# Patient Record
Sex: Female | Born: 1945 | Race: White | Hispanic: No | Marital: Married | Smoking: Former smoker
Health system: Southern US, Community
[De-identification: ages and names within clinical notes are randomized; demographics above are authoritative.]

## PROBLEM LIST (undated history)

## (undated) DIAGNOSIS — M8580 Other specified disorders of bone density and structure, unspecified site: Secondary | ICD-10-CM

## (undated) DIAGNOSIS — E538 Deficiency of other specified B group vitamins: Secondary | ICD-10-CM

## (undated) DIAGNOSIS — E7800 Pure hypercholesterolemia, unspecified: Secondary | ICD-10-CM

## (undated) DIAGNOSIS — F419 Anxiety disorder, unspecified: Secondary | ICD-10-CM

## (undated) DIAGNOSIS — M1990 Unspecified osteoarthritis, unspecified site: Secondary | ICD-10-CM

## (undated) DIAGNOSIS — E559 Vitamin D deficiency, unspecified: Secondary | ICD-10-CM

## (undated) HISTORY — DX: Vitamin D deficiency, unspecified: E55.9

## (undated) HISTORY — DX: Unspecified osteoarthritis, unspecified site: M19.90

## (undated) HISTORY — PX: TONSILLECTOMY AND ADENOIDECTOMY: SHX28

## (undated) HISTORY — DX: Other specified disorders of bone density and structure, unspecified site: M85.80

## (undated) HISTORY — PX: BREAST EXCISIONAL BIOPSY: SUR124

## (undated) HISTORY — DX: Anxiety disorder, unspecified: F41.9

## (undated) HISTORY — PX: TUBAL LIGATION: SHX77

## (undated) HISTORY — DX: Deficiency of other specified B group vitamins: E53.8

## (undated) HISTORY — DX: Pure hypercholesterolemia, unspecified: E78.00

---

## 1997-12-07 ENCOUNTER — Other Ambulatory Visit: Admission: RE | Admit: 1997-12-07 | Discharge: 1997-12-07 | Payer: Self-pay | Admitting: Obstetrics and Gynecology

## 1998-07-03 ENCOUNTER — Other Ambulatory Visit: Admission: RE | Admit: 1998-07-03 | Discharge: 1998-07-03 | Payer: Self-pay | Admitting: Obstetrics and Gynecology

## 1999-08-03 ENCOUNTER — Other Ambulatory Visit: Admission: RE | Admit: 1999-08-03 | Discharge: 1999-08-03 | Payer: Self-pay | Admitting: *Deleted

## 2000-09-11 ENCOUNTER — Other Ambulatory Visit: Admission: RE | Admit: 2000-09-11 | Discharge: 2000-09-11 | Payer: Self-pay | Admitting: *Deleted

## 2000-09-12 ENCOUNTER — Encounter: Payer: Self-pay | Admitting: *Deleted

## 2000-09-12 ENCOUNTER — Encounter: Admission: RE | Admit: 2000-09-12 | Discharge: 2000-09-12 | Payer: Self-pay | Admitting: *Deleted

## 2001-10-14 ENCOUNTER — Other Ambulatory Visit: Admission: RE | Admit: 2001-10-14 | Discharge: 2001-10-14 | Payer: Self-pay | Admitting: Obstetrics and Gynecology

## 2003-02-02 ENCOUNTER — Other Ambulatory Visit: Admission: RE | Admit: 2003-02-02 | Discharge: 2003-02-02 | Payer: Self-pay | Admitting: Obstetrics and Gynecology

## 2003-03-09 ENCOUNTER — Encounter: Payer: Self-pay | Admitting: Obstetrics and Gynecology

## 2003-03-09 ENCOUNTER — Encounter: Admission: RE | Admit: 2003-03-09 | Discharge: 2003-03-09 | Payer: Self-pay | Admitting: Obstetrics and Gynecology

## 2003-04-28 ENCOUNTER — Encounter: Admission: RE | Admit: 2003-04-28 | Discharge: 2003-04-28 | Payer: Self-pay | Admitting: Obstetrics and Gynecology

## 2003-04-28 ENCOUNTER — Encounter (INDEPENDENT_AMBULATORY_CARE_PROVIDER_SITE_OTHER): Payer: Self-pay | Admitting: Specialist

## 2003-04-28 ENCOUNTER — Encounter: Payer: Self-pay | Admitting: Obstetrics and Gynecology

## 2004-05-01 ENCOUNTER — Encounter: Admission: RE | Admit: 2004-05-01 | Discharge: 2004-05-01 | Payer: Self-pay | Admitting: Obstetrics and Gynecology

## 2004-08-13 ENCOUNTER — Encounter (INDEPENDENT_AMBULATORY_CARE_PROVIDER_SITE_OTHER): Payer: Self-pay | Admitting: *Deleted

## 2004-08-13 ENCOUNTER — Ambulatory Visit (HOSPITAL_COMMUNITY): Admission: RE | Admit: 2004-08-13 | Discharge: 2004-08-13 | Payer: Self-pay | Admitting: Gastroenterology

## 2005-04-11 ENCOUNTER — Encounter: Admission: RE | Admit: 2005-04-11 | Discharge: 2005-04-11 | Payer: Self-pay | Admitting: Obstetrics and Gynecology

## 2006-05-01 ENCOUNTER — Encounter: Admission: RE | Admit: 2006-05-01 | Discharge: 2006-05-01 | Payer: Self-pay | Admitting: Obstetrics and Gynecology

## 2007-05-28 ENCOUNTER — Encounter: Admission: RE | Admit: 2007-05-28 | Discharge: 2007-05-28 | Payer: Self-pay | Admitting: Obstetrics & Gynecology

## 2007-06-05 ENCOUNTER — Encounter: Admission: RE | Admit: 2007-06-05 | Discharge: 2007-06-05 | Payer: Self-pay | Admitting: Obstetrics & Gynecology

## 2007-12-03 ENCOUNTER — Encounter: Admission: RE | Admit: 2007-12-03 | Discharge: 2007-12-03 | Payer: Self-pay | Admitting: Obstetrics and Gynecology

## 2008-07-11 ENCOUNTER — Encounter: Admission: RE | Admit: 2008-07-11 | Discharge: 2008-07-11 | Payer: Self-pay | Admitting: Family Medicine

## 2009-09-29 ENCOUNTER — Encounter: Admission: RE | Admit: 2009-09-29 | Discharge: 2009-09-29 | Payer: Self-pay | Admitting: Obstetrics

## 2010-07-29 ENCOUNTER — Encounter: Payer: Self-pay | Admitting: Obstetrics & Gynecology

## 2010-09-04 ENCOUNTER — Other Ambulatory Visit: Payer: Self-pay | Admitting: Obstetrics

## 2010-09-04 DIAGNOSIS — Z1231 Encounter for screening mammogram for malignant neoplasm of breast: Secondary | ICD-10-CM

## 2010-09-12 ENCOUNTER — Other Ambulatory Visit: Payer: Self-pay | Admitting: Obstetrics

## 2010-09-12 DIAGNOSIS — M8580 Other specified disorders of bone density and structure, unspecified site: Secondary | ICD-10-CM

## 2010-11-02 ENCOUNTER — Ambulatory Visit
Admission: RE | Admit: 2010-11-02 | Discharge: 2010-11-02 | Disposition: A | Discharge status: Home / Self Care | Payer: Medicare Other | Source: Ambulatory Visit | Attending: Obstetrics | Admitting: Obstetrics

## 2010-11-02 ENCOUNTER — Ambulatory Visit: Payer: Self-pay

## 2010-11-02 DIAGNOSIS — Z1231 Encounter for screening mammogram for malignant neoplasm of breast: Secondary | ICD-10-CM

## 2010-11-02 DIAGNOSIS — M8580 Other specified disorders of bone density and structure, unspecified site: Secondary | ICD-10-CM

## 2010-11-05 ENCOUNTER — Other Ambulatory Visit: Payer: Self-pay | Admitting: Obstetrics

## 2010-11-05 DIAGNOSIS — R928 Other abnormal and inconclusive findings on diagnostic imaging of breast: Secondary | ICD-10-CM

## 2010-11-12 ENCOUNTER — Other Ambulatory Visit: Payer: Medicare Other

## 2010-11-13 ENCOUNTER — Ambulatory Visit
Admission: RE | Admit: 2010-11-13 | Discharge: 2010-11-13 | Disposition: A | Discharge status: Home / Self Care | Payer: Medicare Other | Source: Ambulatory Visit | Attending: Obstetrics | Admitting: Obstetrics

## 2010-11-13 DIAGNOSIS — R928 Other abnormal and inconclusive findings on diagnostic imaging of breast: Secondary | ICD-10-CM

## 2010-11-23 NOTE — Op Note (Signed)
Nicole Horne, HAGWOOD                ACCOUNT NO.:  [id number]   MEDICAL RECORD NO.:  [id number]          PATIENT TYPE:  AMB   LOCATION:  ENDO                         FACILITY:  [facility]   PHYSICIAN:  Anselmo Rod, M.D.  DATE OF BIRTH:  1946/06/08   DATE OF PROCEDURE:  08/13/2004  DATE OF DISCHARGE:                                 OPERATIVE REPORT   PROCEDURE PERFORMED:  Colonoscopy with snare polypectomy times one (cold  snare).   ENDOSCOPIST:  Charna Elizabeth, M.D.   INSTRUMENT USED:  Olympus video colonoscope.   INDICATIONS FOR PROCEDURE:  The patient is a 65 year old white female  undergoing screening colonoscopy to rule out colonic polyps, masses, etc.   PREPROCEDURE PREPARATION:  Informed consent was procured from the patient.  The patient was fasted for eight hours prior to the procedure and prepped  with a bottle of magnesium citrate and a gallon of GoLYTELY the night prior  to the procedure.  The risks and benefits of the procedure including a 10%  miss rate for colon polyps or cancers was discussed with the patient as  well.   PREPROCEDURE PHYSICAL:  The patient had stable vital signs.  Neck supple.  Chest clear to auscultation.  S1 and S2 regular.  Abdomen soft with normal  bowel sounds.   DESCRIPTION OF PROCEDURE:  The patient was placed in left lateral decubitus  position and sedated with 70 mg of Demerol and 7 mg of Versed in slow  incremental doses.  Once the patient was adequately sedated and maintained  on low flow oxygen and continuous cardiac monitoring, the Olympus video  colonoscope was advanced from the rectum to the cecum with great difficulty.  The patient had a very tortuous colon.  The patient's position was changed  from the left lateral to the supine position with gentle application of  abdominal pressure to reach the cecal base.  The appendicular orifice and  ileocecal valve were clearly visualized and photographed.  The terminal  ileum appeared healthy  and without lesions.  A small sessile polyp was  snared (cold snare) from the rectum.  No other masses, polyps, erosions,  ulcerations or diverticula were seen, retroflexion revealed no  abnormalities.  The patient tolerated the procedure well without immediate  complication.   IMPRESSION:  Normal colonoscopy up to the terminal ileum except for a small  sessile polyp removed by cold snare from the rectum.   RECOMMENDATIONS:  1.  Await pathology results.  2.  Avoid all nonsteroidals including aspirin for the next two weeks.  3.  Repeat colonoscopy depending on pathology results.  4.  Outpatient follow-up as need arises in the future.      JNM/MEDQ  D:  08/13/2004  T:  08/13/2004  Job:  [id number]   cc:   Sherry A. Rosalio Macadamia, M.D.  [address]  [place]  [place] [address]  Fax: [phone]

## 2011-09-07 DIAGNOSIS — H26499 Other secondary cataract, unspecified eye: Secondary | ICD-10-CM | POA: Diagnosis not present

## 2011-09-18 DIAGNOSIS — H11159 Pinguecula, unspecified eye: Secondary | ICD-10-CM | POA: Diagnosis not present

## 2011-09-18 DIAGNOSIS — H26499 Other secondary cataract, unspecified eye: Secondary | ICD-10-CM | POA: Diagnosis not present

## 2011-10-10 DIAGNOSIS — Z124 Encounter for screening for malignant neoplasm of cervix: Secondary | ICD-10-CM | POA: Diagnosis not present

## 2011-10-10 DIAGNOSIS — M899 Disorder of bone, unspecified: Secondary | ICD-10-CM | POA: Diagnosis not present

## 2011-10-10 DIAGNOSIS — M949 Disorder of cartilage, unspecified: Secondary | ICD-10-CM | POA: Diagnosis not present

## 2011-10-19 DIAGNOSIS — H26499 Other secondary cataract, unspecified eye: Secondary | ICD-10-CM | POA: Diagnosis not present

## 2012-01-30 ENCOUNTER — Other Ambulatory Visit: Payer: Self-pay | Admitting: Obstetrics

## 2012-01-30 DIAGNOSIS — Z1231 Encounter for screening mammogram for malignant neoplasm of breast: Secondary | ICD-10-CM

## 2012-02-27 ENCOUNTER — Ambulatory Visit
Admission: RE | Admit: 2012-02-27 | Discharge: 2012-02-27 | Disposition: A | Discharge status: Home / Self Care | Payer: Medicare Other | Source: Ambulatory Visit | Attending: Obstetrics | Admitting: Obstetrics

## 2012-02-27 DIAGNOSIS — Z1231 Encounter for screening mammogram for malignant neoplasm of breast: Secondary | ICD-10-CM

## 2012-04-07 DIAGNOSIS — Z23 Encounter for immunization: Secondary | ICD-10-CM | POA: Diagnosis not present

## 2012-07-31 DIAGNOSIS — L570 Actinic keratosis: Secondary | ICD-10-CM | POA: Diagnosis not present

## 2012-07-31 DIAGNOSIS — L820 Inflamed seborrheic keratosis: Secondary | ICD-10-CM | POA: Diagnosis not present

## 2012-08-06 DIAGNOSIS — C44721 Squamous cell carcinoma of skin of unspecified lower limb, including hip: Secondary | ICD-10-CM | POA: Diagnosis not present

## 2012-12-02 DIAGNOSIS — Z01419 Encounter for gynecological examination (general) (routine) without abnormal findings: Secondary | ICD-10-CM | POA: Diagnosis not present

## 2012-12-02 DIAGNOSIS — Z124 Encounter for screening for malignant neoplasm of cervix: Secondary | ICD-10-CM | POA: Diagnosis not present

## 2012-12-04 ENCOUNTER — Other Ambulatory Visit: Payer: Self-pay | Admitting: Obstetrics

## 2012-12-04 DIAGNOSIS — Z1231 Encounter for screening mammogram for malignant neoplasm of breast: Secondary | ICD-10-CM

## 2012-12-04 DIAGNOSIS — M8580 Other specified disorders of bone density and structure, unspecified site: Secondary | ICD-10-CM

## 2013-01-29 DIAGNOSIS — M1990 Unspecified osteoarthritis, unspecified site: Secondary | ICD-10-CM | POA: Diagnosis not present

## 2013-01-29 DIAGNOSIS — Z0000 Encounter for general adult medical examination without abnormal findings: Secondary | ICD-10-CM | POA: Diagnosis not present

## 2013-01-29 DIAGNOSIS — R5383 Other fatigue: Secondary | ICD-10-CM | POA: Diagnosis not present

## 2013-01-29 DIAGNOSIS — R5381 Other malaise: Secondary | ICD-10-CM | POA: Diagnosis not present

## 2013-02-16 DIAGNOSIS — L821 Other seborrheic keratosis: Secondary | ICD-10-CM | POA: Diagnosis not present

## 2013-03-05 DIAGNOSIS — E782 Mixed hyperlipidemia: Secondary | ICD-10-CM | POA: Diagnosis not present

## 2013-03-05 DIAGNOSIS — Z79899 Other long term (current) drug therapy: Secondary | ICD-10-CM | POA: Diagnosis not present

## 2013-03-05 DIAGNOSIS — I10 Essential (primary) hypertension: Secondary | ICD-10-CM | POA: Diagnosis not present

## 2013-04-02 ENCOUNTER — Ambulatory Visit
Admission: RE | Admit: 2013-04-02 | Discharge: 2013-04-02 | Disposition: A | Discharge status: Home / Self Care | Payer: Medicare Other | Source: Ambulatory Visit | Attending: Obstetrics | Admitting: Obstetrics

## 2013-04-02 ENCOUNTER — Other Ambulatory Visit: Payer: Medicare Other

## 2013-04-02 DIAGNOSIS — Z1231 Encounter for screening mammogram for malignant neoplasm of breast: Secondary | ICD-10-CM | POA: Diagnosis not present

## 2013-04-26 DIAGNOSIS — H26499 Other secondary cataract, unspecified eye: Secondary | ICD-10-CM | POA: Diagnosis not present

## 2013-04-27 DIAGNOSIS — Z23 Encounter for immunization: Secondary | ICD-10-CM | POA: Diagnosis not present

## 2013-05-27 ENCOUNTER — Ambulatory Visit
Admission: RE | Admit: 2013-05-27 | Discharge: 2013-05-27 | Disposition: A | Discharge status: Home / Self Care | Payer: Medicare Other | Source: Ambulatory Visit | Attending: Obstetrics | Admitting: Obstetrics

## 2013-05-27 DIAGNOSIS — M8580 Other specified disorders of bone density and structure, unspecified site: Secondary | ICD-10-CM

## 2013-05-27 DIAGNOSIS — M899 Disorder of bone, unspecified: Secondary | ICD-10-CM | POA: Diagnosis not present

## 2013-09-10 DIAGNOSIS — E7800 Pure hypercholesterolemia, unspecified: Secondary | ICD-10-CM | POA: Diagnosis not present

## 2013-09-10 DIAGNOSIS — Z0000 Encounter for general adult medical examination without abnormal findings: Secondary | ICD-10-CM | POA: Diagnosis not present

## 2013-09-10 DIAGNOSIS — E559 Vitamin D deficiency, unspecified: Secondary | ICD-10-CM | POA: Diagnosis not present

## 2013-09-10 DIAGNOSIS — Z23 Encounter for immunization: Secondary | ICD-10-CM | POA: Diagnosis not present

## 2013-09-10 DIAGNOSIS — R6889 Other general symptoms and signs: Secondary | ICD-10-CM | POA: Diagnosis not present

## 2014-04-07 DIAGNOSIS — Z23 Encounter for immunization: Secondary | ICD-10-CM | POA: Diagnosis not present

## 2014-05-18 ENCOUNTER — Other Ambulatory Visit: Payer: Self-pay

## 2014-05-18 DIAGNOSIS — Z1231 Encounter for screening mammogram for malignant neoplasm of breast: Secondary | ICD-10-CM

## 2014-06-16 ENCOUNTER — Ambulatory Visit
Admission: RE | Admit: 2014-06-16 | Discharge: 2014-06-16 | Disposition: A | Discharge status: Home / Self Care | Payer: Medicare Other | Source: Ambulatory Visit

## 2014-06-16 DIAGNOSIS — Z1231 Encounter for screening mammogram for malignant neoplasm of breast: Secondary | ICD-10-CM | POA: Diagnosis not present

## 2014-09-05 DIAGNOSIS — Z1389 Encounter for screening for other disorder: Secondary | ICD-10-CM | POA: Diagnosis not present

## 2014-09-05 DIAGNOSIS — F341 Dysthymic disorder: Secondary | ICD-10-CM | POA: Diagnosis not present

## 2014-09-05 DIAGNOSIS — Z23 Encounter for immunization: Secondary | ICD-10-CM | POA: Diagnosis not present

## 2014-09-05 DIAGNOSIS — Z79899 Other long term (current) drug therapy: Secondary | ICD-10-CM | POA: Diagnosis not present

## 2014-09-05 DIAGNOSIS — R5383 Other fatigue: Secondary | ICD-10-CM | POA: Diagnosis not present

## 2014-09-05 DIAGNOSIS — E559 Vitamin D deficiency, unspecified: Secondary | ICD-10-CM | POA: Diagnosis not present

## 2014-09-05 DIAGNOSIS — M1990 Unspecified osteoarthritis, unspecified site: Secondary | ICD-10-CM | POA: Diagnosis not present

## 2014-09-05 DIAGNOSIS — D509 Iron deficiency anemia, unspecified: Secondary | ICD-10-CM | POA: Diagnosis not present

## 2015-05-15 DIAGNOSIS — Z23 Encounter for immunization: Secondary | ICD-10-CM | POA: Diagnosis not present

## 2015-06-15 DIAGNOSIS — E7800 Pure hypercholesterolemia, unspecified: Secondary | ICD-10-CM | POA: Diagnosis not present

## 2015-06-15 DIAGNOSIS — F419 Anxiety disorder, unspecified: Secondary | ICD-10-CM | POA: Diagnosis not present

## 2015-06-15 DIAGNOSIS — M8580 Other specified disorders of bone density and structure, unspecified site: Secondary | ICD-10-CM | POA: Diagnosis not present

## 2015-06-21 DIAGNOSIS — M859 Disorder of bone density and structure, unspecified: Secondary | ICD-10-CM | POA: Diagnosis not present

## 2015-06-21 DIAGNOSIS — Z13820 Encounter for screening for osteoporosis: Secondary | ICD-10-CM | POA: Diagnosis not present

## 2015-08-17 DIAGNOSIS — L821 Other seborrheic keratosis: Secondary | ICD-10-CM | POA: Diagnosis not present

## 2015-08-17 DIAGNOSIS — D225 Melanocytic nevi of trunk: Secondary | ICD-10-CM | POA: Diagnosis not present

## 2015-11-10 ENCOUNTER — Other Ambulatory Visit: Payer: Self-pay

## 2015-11-10 DIAGNOSIS — Z1231 Encounter for screening mammogram for malignant neoplasm of breast: Secondary | ICD-10-CM

## 2015-11-23 ENCOUNTER — Ambulatory Visit
Admission: RE | Admit: 2015-11-23 | Discharge: 2015-11-23 | Disposition: A | Discharge status: Home / Self Care | Payer: Medicare Other | Source: Ambulatory Visit

## 2015-11-23 DIAGNOSIS — Z1231 Encounter for screening mammogram for malignant neoplasm of breast: Secondary | ICD-10-CM | POA: Diagnosis not present

## 2016-03-05 ENCOUNTER — Other Ambulatory Visit: Payer: Self-pay

## 2016-04-30 DIAGNOSIS — Z23 Encounter for immunization: Secondary | ICD-10-CM | POA: Diagnosis not present

## 2016-04-30 DIAGNOSIS — Z79899 Other long term (current) drug therapy: Secondary | ICD-10-CM | POA: Diagnosis not present

## 2016-04-30 DIAGNOSIS — Z0000 Encounter for general adult medical examination without abnormal findings: Secondary | ICD-10-CM | POA: Diagnosis not present

## 2016-05-07 DIAGNOSIS — M1990 Unspecified osteoarthritis, unspecified site: Secondary | ICD-10-CM | POA: Diagnosis not present

## 2016-05-07 DIAGNOSIS — Z1389 Encounter for screening for other disorder: Secondary | ICD-10-CM | POA: Diagnosis not present

## 2016-05-07 DIAGNOSIS — Z9181 History of falling: Secondary | ICD-10-CM | POA: Diagnosis not present

## 2016-05-07 DIAGNOSIS — Z0000 Encounter for general adult medical examination without abnormal findings: Secondary | ICD-10-CM | POA: Diagnosis not present

## 2016-05-07 DIAGNOSIS — E785 Hyperlipidemia, unspecified: Secondary | ICD-10-CM | POA: Diagnosis not present

## 2016-08-27 DIAGNOSIS — J40 Bronchitis, not specified as acute or chronic: Secondary | ICD-10-CM | POA: Diagnosis not present

## 2016-12-10 DIAGNOSIS — H524 Presbyopia: Secondary | ICD-10-CM | POA: Diagnosis not present

## 2016-12-10 DIAGNOSIS — H52223 Regular astigmatism, bilateral: Secondary | ICD-10-CM | POA: Diagnosis not present

## 2016-12-10 DIAGNOSIS — Z9842 Cataract extraction status, left eye: Secondary | ICD-10-CM | POA: Diagnosis not present

## 2016-12-10 DIAGNOSIS — Z9841 Cataract extraction status, right eye: Secondary | ICD-10-CM | POA: Diagnosis not present

## 2016-12-10 DIAGNOSIS — H43393 Other vitreous opacities, bilateral: Secondary | ICD-10-CM | POA: Diagnosis not present

## 2016-12-10 DIAGNOSIS — H43813 Vitreous degeneration, bilateral: Secondary | ICD-10-CM | POA: Diagnosis not present

## 2016-12-10 DIAGNOSIS — H5213 Myopia, bilateral: Secondary | ICD-10-CM | POA: Diagnosis not present

## 2016-12-10 DIAGNOSIS — Z961 Presence of intraocular lens: Secondary | ICD-10-CM | POA: Diagnosis not present

## 2017-01-03 ENCOUNTER — Other Ambulatory Visit: Payer: Self-pay | Admitting: Family Medicine

## 2017-01-03 DIAGNOSIS — Z1231 Encounter for screening mammogram for malignant neoplasm of breast: Secondary | ICD-10-CM

## 2017-01-21 ENCOUNTER — Ambulatory Visit
Admission: RE | Admit: 2017-01-21 | Discharge: 2017-01-21 | Disposition: A | Discharge status: Home / Self Care | Payer: Medicare Other | Source: Ambulatory Visit | Attending: Family Medicine | Admitting: Family Medicine

## 2017-01-21 DIAGNOSIS — Z1231 Encounter for screening mammogram for malignant neoplasm of breast: Secondary | ICD-10-CM | POA: Diagnosis not present

## 2017-02-14 ENCOUNTER — Ambulatory Visit (INDEPENDENT_AMBULATORY_CARE_PROVIDER_SITE_OTHER): Payer: Medicare Other | Admitting: Podiatry

## 2017-02-14 ENCOUNTER — Encounter: Payer: Self-pay | Admitting: Podiatry

## 2017-02-14 ENCOUNTER — Ambulatory Visit (INDEPENDENT_AMBULATORY_CARE_PROVIDER_SITE_OTHER): Payer: Medicare Other

## 2017-02-14 DIAGNOSIS — M21619 Bunion of unspecified foot: Secondary | ICD-10-CM | POA: Diagnosis not present

## 2017-02-14 DIAGNOSIS — M2040 Other hammer toe(s) (acquired), unspecified foot: Secondary | ICD-10-CM | POA: Diagnosis not present

## 2017-02-14 NOTE — Progress Notes (Signed)
Subjective:    Patient ID: Nicole Horne, female   DOB: 71 y.o.   MRN: [id number]   HPI patient presents with significant structural deformities right over left foot with history of bunion and hallux right overlapping the second toe    Review of Systems  All other systems reviewed and are negative.       Objective:  Physical Exam  Constitutional: She appears well-developed and well-nourished.  Cardiovascular: Intact distal pulses.   Musculoskeletal: Normal range of motion.  Neurological: She is alert.  Skin: Skin is warm.  Nursing note and vitals reviewed.  neurovascular status found to be intact muscle strength is adequate range of motion within normal limits with patient found to have significant structural deformity right over left with history of surgery on both feet with Lapidus type fusions. It is not significant with painful but it is cosmetically concerning to the patient and she's concerned about future     Assessment:   Significant structural changes with hallux overlapping the second toe right over left      Plan:     H&P x-ray reviewed and different treatment options discussed. At this point were to avoid surgery and just utilize wider-type shoes and if he gets worse we'll need to consider fusion of the first MPJ which would be necessary for this  X-ray indicates there is significant structural malalignment with screws that are in place with good fusion of the metatarsocuneiform joint

## 2017-02-14 NOTE — Progress Notes (Signed)
   Subjective:    Patient ID: Nicole Horne, female    DOB: 1946-03-16, 71 y.o.   MRN: [id number]  HPI Chief Complaint  Patient presents with  . Hammertoe    Right foot; great toe going over 2nd toe; pt stated, "Had hammertoe surgery approximately 8 to 10 years ago and now toes are going back over each other again"; x5-6 years      Review of Systems  All other systems reviewed and are negative.      Objective:   Physical Exam        Assessment & Plan:

## 2017-05-07 DIAGNOSIS — E785 Hyperlipidemia, unspecified: Secondary | ICD-10-CM | POA: Diagnosis not present

## 2017-05-07 DIAGNOSIS — Z79899 Other long term (current) drug therapy: Secondary | ICD-10-CM | POA: Diagnosis not present

## 2017-05-28 DIAGNOSIS — D72819 Decreased white blood cell count, unspecified: Secondary | ICD-10-CM | POA: Diagnosis not present

## 2017-05-28 DIAGNOSIS — Z6825 Body mass index (BMI) 25.0-25.9, adult: Secondary | ICD-10-CM | POA: Diagnosis not present

## 2017-05-28 DIAGNOSIS — Z9181 History of falling: Secondary | ICD-10-CM | POA: Diagnosis not present

## 2017-05-28 DIAGNOSIS — Z1339 Encounter for screening examination for other mental health and behavioral disorders: Secondary | ICD-10-CM | POA: Diagnosis not present

## 2017-05-28 DIAGNOSIS — M1990 Unspecified osteoarthritis, unspecified site: Secondary | ICD-10-CM | POA: Diagnosis not present

## 2017-05-28 DIAGNOSIS — Z0000 Encounter for general adult medical examination without abnormal findings: Secondary | ICD-10-CM | POA: Diagnosis not present

## 2017-06-06 DIAGNOSIS — D510 Vitamin B12 deficiency anemia due to intrinsic factor deficiency: Secondary | ICD-10-CM | POA: Diagnosis not present

## 2017-06-13 DIAGNOSIS — D510 Vitamin B12 deficiency anemia due to intrinsic factor deficiency: Secondary | ICD-10-CM | POA: Diagnosis not present

## 2017-06-20 DIAGNOSIS — D510 Vitamin B12 deficiency anemia due to intrinsic factor deficiency: Secondary | ICD-10-CM | POA: Diagnosis not present

## 2017-07-03 DIAGNOSIS — D510 Vitamin B12 deficiency anemia due to intrinsic factor deficiency: Secondary | ICD-10-CM | POA: Diagnosis not present

## 2017-08-08 DIAGNOSIS — D510 Vitamin B12 deficiency anemia due to intrinsic factor deficiency: Secondary | ICD-10-CM | POA: Diagnosis not present

## 2017-09-05 DIAGNOSIS — D510 Vitamin B12 deficiency anemia due to intrinsic factor deficiency: Secondary | ICD-10-CM | POA: Diagnosis not present

## 2017-10-06 DIAGNOSIS — D510 Vitamin B12 deficiency anemia due to intrinsic factor deficiency: Secondary | ICD-10-CM | POA: Diagnosis not present

## 2017-11-11 DIAGNOSIS — Z13820 Encounter for screening for osteoporosis: Secondary | ICD-10-CM | POA: Diagnosis not present

## 2017-11-11 DIAGNOSIS — D510 Vitamin B12 deficiency anemia due to intrinsic factor deficiency: Secondary | ICD-10-CM | POA: Diagnosis not present

## 2017-12-09 DIAGNOSIS — D510 Vitamin B12 deficiency anemia due to intrinsic factor deficiency: Secondary | ICD-10-CM | POA: Diagnosis not present

## 2018-01-15 DIAGNOSIS — D510 Vitamin B12 deficiency anemia due to intrinsic factor deficiency: Secondary | ICD-10-CM | POA: Diagnosis not present

## 2018-02-10 DIAGNOSIS — D510 Vitamin B12 deficiency anemia due to intrinsic factor deficiency: Secondary | ICD-10-CM | POA: Diagnosis not present

## 2018-03-17 DIAGNOSIS — Z23 Encounter for immunization: Secondary | ICD-10-CM | POA: Diagnosis not present

## 2018-03-17 DIAGNOSIS — D510 Vitamin B12 deficiency anemia due to intrinsic factor deficiency: Secondary | ICD-10-CM | POA: Diagnosis not present

## 2018-03-24 DIAGNOSIS — D1801 Hemangioma of skin and subcutaneous tissue: Secondary | ICD-10-CM | POA: Diagnosis not present

## 2018-03-24 DIAGNOSIS — L814 Other melanin hyperpigmentation: Secondary | ICD-10-CM | POA: Diagnosis not present

## 2018-03-24 DIAGNOSIS — L820 Inflamed seborrheic keratosis: Secondary | ICD-10-CM | POA: Diagnosis not present

## 2018-04-07 ENCOUNTER — Other Ambulatory Visit: Payer: Self-pay | Admitting: Family Medicine

## 2018-04-07 DIAGNOSIS — Z1239 Encounter for other screening for malignant neoplasm of breast: Secondary | ICD-10-CM

## 2018-04-07 DIAGNOSIS — Z1231 Encounter for screening mammogram for malignant neoplasm of breast: Secondary | ICD-10-CM

## 2018-04-08 DIAGNOSIS — D510 Vitamin B12 deficiency anemia due to intrinsic factor deficiency: Secondary | ICD-10-CM | POA: Diagnosis not present

## 2018-05-11 ENCOUNTER — Ambulatory Visit
Admission: RE | Admit: 2018-05-11 | Discharge: 2018-05-11 | Disposition: A | Discharge status: Home / Self Care | Payer: Medicare Other | Source: Ambulatory Visit | Attending: Family Medicine | Admitting: Family Medicine

## 2018-05-11 DIAGNOSIS — Z1231 Encounter for screening mammogram for malignant neoplasm of breast: Secondary | ICD-10-CM | POA: Diagnosis not present

## 2018-05-15 DIAGNOSIS — D510 Vitamin B12 deficiency anemia due to intrinsic factor deficiency: Secondary | ICD-10-CM | POA: Diagnosis not present

## 2018-06-08 DIAGNOSIS — D510 Vitamin B12 deficiency anemia due to intrinsic factor deficiency: Secondary | ICD-10-CM | POA: Diagnosis not present

## 2018-06-08 DIAGNOSIS — E785 Hyperlipidemia, unspecified: Secondary | ICD-10-CM | POA: Diagnosis not present

## 2018-06-08 DIAGNOSIS — E559 Vitamin D deficiency, unspecified: Secondary | ICD-10-CM | POA: Diagnosis not present

## 2018-06-08 DIAGNOSIS — Z9181 History of falling: Secondary | ICD-10-CM | POA: Diagnosis not present

## 2018-06-08 DIAGNOSIS — Z6824 Body mass index (BMI) 24.0-24.9, adult: Secondary | ICD-10-CM | POA: Diagnosis not present

## 2018-06-08 DIAGNOSIS — S41119A Laceration without foreign body of unspecified upper arm, initial encounter: Secondary | ICD-10-CM | POA: Diagnosis not present

## 2018-06-08 DIAGNOSIS — Z79899 Other long term (current) drug therapy: Secondary | ICD-10-CM | POA: Diagnosis not present

## 2018-06-08 DIAGNOSIS — M1990 Unspecified osteoarthritis, unspecified site: Secondary | ICD-10-CM | POA: Diagnosis not present

## 2018-06-08 DIAGNOSIS — Z0000 Encounter for general adult medical examination without abnormal findings: Secondary | ICD-10-CM | POA: Diagnosis not present

## 2018-06-17 DIAGNOSIS — D539 Nutritional anemia, unspecified: Secondary | ICD-10-CM | POA: Diagnosis not present

## 2018-07-06 DIAGNOSIS — L820 Inflamed seborrheic keratosis: Secondary | ICD-10-CM | POA: Diagnosis not present

## 2018-07-06 DIAGNOSIS — D510 Vitamin B12 deficiency anemia due to intrinsic factor deficiency: Secondary | ICD-10-CM | POA: Diagnosis not present

## 2018-07-06 DIAGNOSIS — C44622 Squamous cell carcinoma of skin of right upper limb, including shoulder: Secondary | ICD-10-CM | POA: Diagnosis not present

## 2018-07-13 DIAGNOSIS — C44622 Squamous cell carcinoma of skin of right upper limb, including shoulder: Secondary | ICD-10-CM | POA: Diagnosis not present

## 2018-07-21 DIAGNOSIS — Z86010 Personal history of colonic polyps: Secondary | ICD-10-CM | POA: Diagnosis not present

## 2018-07-21 DIAGNOSIS — D649 Anemia, unspecified: Secondary | ICD-10-CM | POA: Diagnosis not present

## 2018-08-17 DIAGNOSIS — D510 Vitamin B12 deficiency anemia due to intrinsic factor deficiency: Secondary | ICD-10-CM | POA: Diagnosis not present

## 2018-08-21 DIAGNOSIS — D649 Anemia, unspecified: Secondary | ICD-10-CM | POA: Diagnosis not present

## 2018-08-25 DIAGNOSIS — D509 Iron deficiency anemia, unspecified: Secondary | ICD-10-CM | POA: Diagnosis not present

## 2018-08-25 DIAGNOSIS — Z1212 Encounter for screening for malignant neoplasm of rectum: Secondary | ICD-10-CM | POA: Diagnosis not present

## 2018-09-16 DIAGNOSIS — E538 Deficiency of other specified B group vitamins: Secondary | ICD-10-CM | POA: Diagnosis not present

## 2018-09-21 DIAGNOSIS — K2900 Acute gastritis without bleeding: Secondary | ICD-10-CM | POA: Diagnosis not present

## 2018-09-21 DIAGNOSIS — Z86010 Personal history of colonic polyps: Secondary | ICD-10-CM | POA: Diagnosis not present

## 2018-09-21 DIAGNOSIS — K2950 Unspecified chronic gastritis without bleeding: Secondary | ICD-10-CM | POA: Diagnosis not present

## 2018-09-21 DIAGNOSIS — Z791 Long term (current) use of non-steroidal anti-inflammatories (NSAID): Secondary | ICD-10-CM | POA: Diagnosis not present

## 2018-09-21 DIAGNOSIS — D509 Iron deficiency anemia, unspecified: Secondary | ICD-10-CM | POA: Diagnosis not present

## 2018-10-22 DIAGNOSIS — D509 Iron deficiency anemia, unspecified: Secondary | ICD-10-CM | POA: Diagnosis not present

## 2018-10-22 DIAGNOSIS — E538 Deficiency of other specified B group vitamins: Secondary | ICD-10-CM | POA: Diagnosis not present

## 2018-10-26 DIAGNOSIS — D509 Iron deficiency anemia, unspecified: Secondary | ICD-10-CM | POA: Diagnosis not present

## 2018-10-27 DIAGNOSIS — Z791 Long term (current) use of non-steroidal anti-inflammatories (NSAID): Secondary | ICD-10-CM | POA: Diagnosis not present

## 2018-10-27 DIAGNOSIS — D509 Iron deficiency anemia, unspecified: Secondary | ICD-10-CM | POA: Diagnosis not present

## 2018-10-27 DIAGNOSIS — R195 Other fecal abnormalities: Secondary | ICD-10-CM | POA: Diagnosis not present

## 2018-11-27 DIAGNOSIS — D519 Vitamin B12 deficiency anemia, unspecified: Secondary | ICD-10-CM | POA: Diagnosis not present

## 2018-12-02 DIAGNOSIS — C44622 Squamous cell carcinoma of skin of right upper limb, including shoulder: Secondary | ICD-10-CM | POA: Diagnosis not present

## 2018-12-02 DIAGNOSIS — L820 Inflamed seborrheic keratosis: Secondary | ICD-10-CM | POA: Diagnosis not present

## 2018-12-30 DIAGNOSIS — D519 Vitamin B12 deficiency anemia, unspecified: Secondary | ICD-10-CM | POA: Diagnosis not present

## 2019-02-02 DIAGNOSIS — E538 Deficiency of other specified B group vitamins: Secondary | ICD-10-CM | POA: Diagnosis not present

## 2019-03-10 DIAGNOSIS — E559 Vitamin D deficiency, unspecified: Secondary | ICD-10-CM | POA: Diagnosis not present

## 2019-03-10 DIAGNOSIS — Z23 Encounter for immunization: Secondary | ICD-10-CM | POA: Diagnosis not present

## 2019-03-26 DIAGNOSIS — L03312 Cellulitis of back [any part except buttock]: Secondary | ICD-10-CM | POA: Diagnosis not present

## 2019-03-26 DIAGNOSIS — L03113 Cellulitis of right upper limb: Secondary | ICD-10-CM | POA: Diagnosis not present

## 2019-04-01 DIAGNOSIS — L03113 Cellulitis of right upper limb: Secondary | ICD-10-CM | POA: Diagnosis not present

## 2019-04-01 DIAGNOSIS — L0103 Bullous impetigo: Secondary | ICD-10-CM | POA: Diagnosis not present

## 2019-04-08 DIAGNOSIS — D519 Vitamin B12 deficiency anemia, unspecified: Secondary | ICD-10-CM | POA: Diagnosis not present

## 2019-04-12 DIAGNOSIS — D509 Iron deficiency anemia, unspecified: Secondary | ICD-10-CM | POA: Diagnosis not present

## 2019-04-15 DIAGNOSIS — D509 Iron deficiency anemia, unspecified: Secondary | ICD-10-CM | POA: Diagnosis not present

## 2019-04-22 DIAGNOSIS — R195 Other fecal abnormalities: Secondary | ICD-10-CM | POA: Diagnosis not present

## 2019-04-22 DIAGNOSIS — D509 Iron deficiency anemia, unspecified: Secondary | ICD-10-CM | POA: Diagnosis not present

## 2019-05-11 DIAGNOSIS — D519 Vitamin B12 deficiency anemia, unspecified: Secondary | ICD-10-CM | POA: Diagnosis not present

## 2019-05-17 DIAGNOSIS — H43393 Other vitreous opacities, bilateral: Secondary | ICD-10-CM | POA: Diagnosis not present

## 2019-05-20 DIAGNOSIS — D509 Iron deficiency anemia, unspecified: Secondary | ICD-10-CM | POA: Diagnosis not present

## 2019-06-02 ENCOUNTER — Other Ambulatory Visit: Payer: Self-pay

## 2019-06-11 DIAGNOSIS — E538 Deficiency of other specified B group vitamins: Secondary | ICD-10-CM | POA: Diagnosis not present

## 2019-06-14 DIAGNOSIS — M79602 Pain in left arm: Secondary | ICD-10-CM | POA: Diagnosis not present

## 2019-06-14 DIAGNOSIS — Z6826 Body mass index (BMI) 26.0-26.9, adult: Secondary | ICD-10-CM | POA: Diagnosis not present

## 2019-06-16 ENCOUNTER — Other Ambulatory Visit: Payer: Self-pay | Admitting: Family Medicine

## 2019-06-16 DIAGNOSIS — Z1231 Encounter for screening mammogram for malignant neoplasm of breast: Secondary | ICD-10-CM

## 2019-08-06 ENCOUNTER — Ambulatory Visit
Admission: RE | Admit: 2019-08-06 | Discharge: 2019-08-06 | Disposition: A | Discharge status: Home / Self Care | Payer: Medicare PPO | Source: Ambulatory Visit | Attending: Family Medicine | Admitting: Family Medicine

## 2019-08-06 ENCOUNTER — Other Ambulatory Visit: Payer: Self-pay

## 2019-08-06 DIAGNOSIS — Z1231 Encounter for screening mammogram for malignant neoplasm of breast: Secondary | ICD-10-CM

## 2019-09-07 DIAGNOSIS — R293 Abnormal posture: Secondary | ICD-10-CM | POA: Diagnosis not present

## 2019-09-07 DIAGNOSIS — M25511 Pain in right shoulder: Secondary | ICD-10-CM | POA: Diagnosis not present

## 2019-09-07 DIAGNOSIS — M79622 Pain in left upper arm: Secondary | ICD-10-CM | POA: Diagnosis not present

## 2019-09-07 DIAGNOSIS — M25512 Pain in left shoulder: Secondary | ICD-10-CM | POA: Diagnosis not present

## 2019-09-07 DIAGNOSIS — D509 Iron deficiency anemia, unspecified: Secondary | ICD-10-CM | POA: Diagnosis not present

## 2019-09-07 DIAGNOSIS — M2560 Stiffness of unspecified joint, not elsewhere classified: Secondary | ICD-10-CM | POA: Diagnosis not present

## 2019-09-07 DIAGNOSIS — M25612 Stiffness of left shoulder, not elsewhere classified: Secondary | ICD-10-CM | POA: Diagnosis not present

## 2019-09-07 DIAGNOSIS — M6281 Muscle weakness (generalized): Secondary | ICD-10-CM | POA: Diagnosis not present

## 2019-09-07 DIAGNOSIS — M542 Cervicalgia: Secondary | ICD-10-CM | POA: Diagnosis not present

## 2019-09-10 DIAGNOSIS — M25511 Pain in right shoulder: Secondary | ICD-10-CM | POA: Diagnosis not present

## 2019-09-10 DIAGNOSIS — M79622 Pain in left upper arm: Secondary | ICD-10-CM | POA: Diagnosis not present

## 2019-09-10 DIAGNOSIS — M25512 Pain in left shoulder: Secondary | ICD-10-CM | POA: Diagnosis not present

## 2019-09-10 DIAGNOSIS — M6281 Muscle weakness (generalized): Secondary | ICD-10-CM | POA: Diagnosis not present

## 2019-09-10 DIAGNOSIS — M542 Cervicalgia: Secondary | ICD-10-CM | POA: Diagnosis not present

## 2019-09-10 DIAGNOSIS — R293 Abnormal posture: Secondary | ICD-10-CM | POA: Diagnosis not present

## 2019-09-10 DIAGNOSIS — M25612 Stiffness of left shoulder, not elsewhere classified: Secondary | ICD-10-CM | POA: Diagnosis not present

## 2019-09-10 DIAGNOSIS — M2560 Stiffness of unspecified joint, not elsewhere classified: Secondary | ICD-10-CM | POA: Diagnosis not present

## 2019-09-14 DIAGNOSIS — M2560 Stiffness of unspecified joint, not elsewhere classified: Secondary | ICD-10-CM | POA: Diagnosis not present

## 2019-09-14 DIAGNOSIS — D519 Vitamin B12 deficiency anemia, unspecified: Secondary | ICD-10-CM | POA: Diagnosis not present

## 2019-09-14 DIAGNOSIS — M6281 Muscle weakness (generalized): Secondary | ICD-10-CM | POA: Diagnosis not present

## 2019-09-14 DIAGNOSIS — R293 Abnormal posture: Secondary | ICD-10-CM | POA: Diagnosis not present

## 2019-09-14 DIAGNOSIS — M542 Cervicalgia: Secondary | ICD-10-CM | POA: Diagnosis not present

## 2019-09-14 DIAGNOSIS — M79622 Pain in left upper arm: Secondary | ICD-10-CM | POA: Diagnosis not present

## 2019-09-14 DIAGNOSIS — M25512 Pain in left shoulder: Secondary | ICD-10-CM | POA: Diagnosis not present

## 2019-09-14 DIAGNOSIS — M25511 Pain in right shoulder: Secondary | ICD-10-CM | POA: Diagnosis not present

## 2019-09-14 DIAGNOSIS — M25612 Stiffness of left shoulder, not elsewhere classified: Secondary | ICD-10-CM | POA: Diagnosis not present

## 2019-09-17 DIAGNOSIS — M6281 Muscle weakness (generalized): Secondary | ICD-10-CM | POA: Diagnosis not present

## 2019-09-17 DIAGNOSIS — M542 Cervicalgia: Secondary | ICD-10-CM | POA: Diagnosis not present

## 2019-09-17 DIAGNOSIS — M25512 Pain in left shoulder: Secondary | ICD-10-CM | POA: Diagnosis not present

## 2019-09-17 DIAGNOSIS — M79622 Pain in left upper arm: Secondary | ICD-10-CM | POA: Diagnosis not present

## 2019-09-17 DIAGNOSIS — M25612 Stiffness of left shoulder, not elsewhere classified: Secondary | ICD-10-CM | POA: Diagnosis not present

## 2019-09-17 DIAGNOSIS — M25511 Pain in right shoulder: Secondary | ICD-10-CM | POA: Diagnosis not present

## 2019-09-17 DIAGNOSIS — M2560 Stiffness of unspecified joint, not elsewhere classified: Secondary | ICD-10-CM | POA: Diagnosis not present

## 2019-09-17 DIAGNOSIS — R293 Abnormal posture: Secondary | ICD-10-CM | POA: Diagnosis not present

## 2019-09-20 DIAGNOSIS — M25511 Pain in right shoulder: Secondary | ICD-10-CM | POA: Diagnosis not present

## 2019-09-20 DIAGNOSIS — M2560 Stiffness of unspecified joint, not elsewhere classified: Secondary | ICD-10-CM | POA: Diagnosis not present

## 2019-09-20 DIAGNOSIS — M6281 Muscle weakness (generalized): Secondary | ICD-10-CM | POA: Diagnosis not present

## 2019-09-20 DIAGNOSIS — M542 Cervicalgia: Secondary | ICD-10-CM | POA: Diagnosis not present

## 2019-09-20 DIAGNOSIS — R293 Abnormal posture: Secondary | ICD-10-CM | POA: Diagnosis not present

## 2019-09-20 DIAGNOSIS — M79622 Pain in left upper arm: Secondary | ICD-10-CM | POA: Diagnosis not present

## 2019-09-20 DIAGNOSIS — M25512 Pain in left shoulder: Secondary | ICD-10-CM | POA: Diagnosis not present

## 2019-09-20 DIAGNOSIS — M25612 Stiffness of left shoulder, not elsewhere classified: Secondary | ICD-10-CM | POA: Diagnosis not present

## 2019-09-24 DIAGNOSIS — M25612 Stiffness of left shoulder, not elsewhere classified: Secondary | ICD-10-CM | POA: Diagnosis not present

## 2019-09-24 DIAGNOSIS — M25511 Pain in right shoulder: Secondary | ICD-10-CM | POA: Diagnosis not present

## 2019-09-24 DIAGNOSIS — M25512 Pain in left shoulder: Secondary | ICD-10-CM | POA: Diagnosis not present

## 2019-09-24 DIAGNOSIS — M79622 Pain in left upper arm: Secondary | ICD-10-CM | POA: Diagnosis not present

## 2019-09-24 DIAGNOSIS — R293 Abnormal posture: Secondary | ICD-10-CM | POA: Diagnosis not present

## 2019-09-24 DIAGNOSIS — D509 Iron deficiency anemia, unspecified: Secondary | ICD-10-CM | POA: Diagnosis not present

## 2019-09-24 DIAGNOSIS — M6281 Muscle weakness (generalized): Secondary | ICD-10-CM | POA: Diagnosis not present

## 2019-09-24 DIAGNOSIS — M542 Cervicalgia: Secondary | ICD-10-CM | POA: Diagnosis not present

## 2019-09-24 DIAGNOSIS — M2560 Stiffness of unspecified joint, not elsewhere classified: Secondary | ICD-10-CM | POA: Diagnosis not present

## 2019-09-28 DIAGNOSIS — M25512 Pain in left shoulder: Secondary | ICD-10-CM | POA: Diagnosis not present

## 2019-09-28 DIAGNOSIS — M79622 Pain in left upper arm: Secondary | ICD-10-CM | POA: Diagnosis not present

## 2019-09-28 DIAGNOSIS — M6281 Muscle weakness (generalized): Secondary | ICD-10-CM | POA: Diagnosis not present

## 2019-09-28 DIAGNOSIS — R293 Abnormal posture: Secondary | ICD-10-CM | POA: Diagnosis not present

## 2019-09-28 DIAGNOSIS — R195 Other fecal abnormalities: Secondary | ICD-10-CM | POA: Diagnosis not present

## 2019-09-28 DIAGNOSIS — M542 Cervicalgia: Secondary | ICD-10-CM | POA: Diagnosis not present

## 2019-09-28 DIAGNOSIS — M25612 Stiffness of left shoulder, not elsewhere classified: Secondary | ICD-10-CM | POA: Diagnosis not present

## 2019-09-28 DIAGNOSIS — M25511 Pain in right shoulder: Secondary | ICD-10-CM | POA: Diagnosis not present

## 2019-09-28 DIAGNOSIS — D500 Iron deficiency anemia secondary to blood loss (chronic): Secondary | ICD-10-CM | POA: Diagnosis not present

## 2019-09-28 DIAGNOSIS — M2560 Stiffness of unspecified joint, not elsewhere classified: Secondary | ICD-10-CM | POA: Diagnosis not present

## 2019-09-30 DIAGNOSIS — M542 Cervicalgia: Secondary | ICD-10-CM | POA: Diagnosis not present

## 2019-09-30 DIAGNOSIS — R293 Abnormal posture: Secondary | ICD-10-CM | POA: Diagnosis not present

## 2019-09-30 DIAGNOSIS — M25612 Stiffness of left shoulder, not elsewhere classified: Secondary | ICD-10-CM | POA: Diagnosis not present

## 2019-09-30 DIAGNOSIS — M6281 Muscle weakness (generalized): Secondary | ICD-10-CM | POA: Diagnosis not present

## 2019-09-30 DIAGNOSIS — M2560 Stiffness of unspecified joint, not elsewhere classified: Secondary | ICD-10-CM | POA: Diagnosis not present

## 2019-09-30 DIAGNOSIS — M79622 Pain in left upper arm: Secondary | ICD-10-CM | POA: Diagnosis not present

## 2019-09-30 DIAGNOSIS — M25512 Pain in left shoulder: Secondary | ICD-10-CM | POA: Diagnosis not present

## 2019-09-30 DIAGNOSIS — M25511 Pain in right shoulder: Secondary | ICD-10-CM | POA: Diagnosis not present

## 2019-10-04 DIAGNOSIS — M25511 Pain in right shoulder: Secondary | ICD-10-CM | POA: Diagnosis not present

## 2019-10-04 DIAGNOSIS — M25612 Stiffness of left shoulder, not elsewhere classified: Secondary | ICD-10-CM | POA: Diagnosis not present

## 2019-10-04 DIAGNOSIS — R293 Abnormal posture: Secondary | ICD-10-CM | POA: Diagnosis not present

## 2019-10-04 DIAGNOSIS — M2560 Stiffness of unspecified joint, not elsewhere classified: Secondary | ICD-10-CM | POA: Diagnosis not present

## 2019-10-04 DIAGNOSIS — M25512 Pain in left shoulder: Secondary | ICD-10-CM | POA: Diagnosis not present

## 2019-10-04 DIAGNOSIS — M79622 Pain in left upper arm: Secondary | ICD-10-CM | POA: Diagnosis not present

## 2019-10-04 DIAGNOSIS — M6281 Muscle weakness (generalized): Secondary | ICD-10-CM | POA: Diagnosis not present

## 2019-10-04 DIAGNOSIS — M542 Cervicalgia: Secondary | ICD-10-CM | POA: Diagnosis not present

## 2019-10-07 DIAGNOSIS — M6281 Muscle weakness (generalized): Secondary | ICD-10-CM | POA: Diagnosis not present

## 2019-10-07 DIAGNOSIS — M79622 Pain in left upper arm: Secondary | ICD-10-CM | POA: Diagnosis not present

## 2019-10-07 DIAGNOSIS — R293 Abnormal posture: Secondary | ICD-10-CM | POA: Diagnosis not present

## 2019-10-07 DIAGNOSIS — M2560 Stiffness of unspecified joint, not elsewhere classified: Secondary | ICD-10-CM | POA: Diagnosis not present

## 2019-10-07 DIAGNOSIS — M542 Cervicalgia: Secondary | ICD-10-CM | POA: Diagnosis not present

## 2019-10-07 DIAGNOSIS — M25512 Pain in left shoulder: Secondary | ICD-10-CM | POA: Diagnosis not present

## 2019-10-07 DIAGNOSIS — M25612 Stiffness of left shoulder, not elsewhere classified: Secondary | ICD-10-CM | POA: Diagnosis not present

## 2019-10-12 DIAGNOSIS — M6281 Muscle weakness (generalized): Secondary | ICD-10-CM | POA: Diagnosis not present

## 2019-10-12 DIAGNOSIS — M79622 Pain in left upper arm: Secondary | ICD-10-CM | POA: Diagnosis not present

## 2019-10-12 DIAGNOSIS — R293 Abnormal posture: Secondary | ICD-10-CM | POA: Diagnosis not present

## 2019-10-12 DIAGNOSIS — M25612 Stiffness of left shoulder, not elsewhere classified: Secondary | ICD-10-CM | POA: Diagnosis not present

## 2019-10-12 DIAGNOSIS — M542 Cervicalgia: Secondary | ICD-10-CM | POA: Diagnosis not present

## 2019-10-12 DIAGNOSIS — M2560 Stiffness of unspecified joint, not elsewhere classified: Secondary | ICD-10-CM | POA: Diagnosis not present

## 2019-10-12 DIAGNOSIS — M25512 Pain in left shoulder: Secondary | ICD-10-CM | POA: Diagnosis not present

## 2019-10-15 DIAGNOSIS — R293 Abnormal posture: Secondary | ICD-10-CM | POA: Diagnosis not present

## 2019-10-15 DIAGNOSIS — M25512 Pain in left shoulder: Secondary | ICD-10-CM | POA: Diagnosis not present

## 2019-10-15 DIAGNOSIS — M6281 Muscle weakness (generalized): Secondary | ICD-10-CM | POA: Diagnosis not present

## 2019-10-15 DIAGNOSIS — M542 Cervicalgia: Secondary | ICD-10-CM | POA: Diagnosis not present

## 2019-10-15 DIAGNOSIS — M79622 Pain in left upper arm: Secondary | ICD-10-CM | POA: Diagnosis not present

## 2019-10-15 DIAGNOSIS — M2560 Stiffness of unspecified joint, not elsewhere classified: Secondary | ICD-10-CM | POA: Diagnosis not present

## 2019-10-15 DIAGNOSIS — M25612 Stiffness of left shoulder, not elsewhere classified: Secondary | ICD-10-CM | POA: Diagnosis not present

## 2019-10-22 DIAGNOSIS — M25612 Stiffness of left shoulder, not elsewhere classified: Secondary | ICD-10-CM | POA: Diagnosis not present

## 2019-10-22 DIAGNOSIS — M6281 Muscle weakness (generalized): Secondary | ICD-10-CM | POA: Diagnosis not present

## 2019-10-22 DIAGNOSIS — M25512 Pain in left shoulder: Secondary | ICD-10-CM | POA: Diagnosis not present

## 2019-10-22 DIAGNOSIS — M2560 Stiffness of unspecified joint, not elsewhere classified: Secondary | ICD-10-CM | POA: Diagnosis not present

## 2019-10-22 DIAGNOSIS — M79622 Pain in left upper arm: Secondary | ICD-10-CM | POA: Diagnosis not present

## 2019-10-22 DIAGNOSIS — M542 Cervicalgia: Secondary | ICD-10-CM | POA: Diagnosis not present

## 2019-10-22 DIAGNOSIS — R293 Abnormal posture: Secondary | ICD-10-CM | POA: Diagnosis not present

## 2019-10-26 DIAGNOSIS — M542 Cervicalgia: Secondary | ICD-10-CM | POA: Diagnosis not present

## 2019-10-26 DIAGNOSIS — R293 Abnormal posture: Secondary | ICD-10-CM | POA: Diagnosis not present

## 2019-10-26 DIAGNOSIS — M25612 Stiffness of left shoulder, not elsewhere classified: Secondary | ICD-10-CM | POA: Diagnosis not present

## 2019-10-26 DIAGNOSIS — M6281 Muscle weakness (generalized): Secondary | ICD-10-CM | POA: Diagnosis not present

## 2019-10-26 DIAGNOSIS — M2560 Stiffness of unspecified joint, not elsewhere classified: Secondary | ICD-10-CM | POA: Diagnosis not present

## 2019-10-26 DIAGNOSIS — D519 Vitamin B12 deficiency anemia, unspecified: Secondary | ICD-10-CM | POA: Diagnosis not present

## 2019-10-26 DIAGNOSIS — M25512 Pain in left shoulder: Secondary | ICD-10-CM | POA: Diagnosis not present

## 2019-10-26 DIAGNOSIS — M79622 Pain in left upper arm: Secondary | ICD-10-CM | POA: Diagnosis not present

## 2019-11-03 DIAGNOSIS — R293 Abnormal posture: Secondary | ICD-10-CM | POA: Diagnosis not present

## 2019-11-03 DIAGNOSIS — M6281 Muscle weakness (generalized): Secondary | ICD-10-CM | POA: Diagnosis not present

## 2019-11-03 DIAGNOSIS — M25612 Stiffness of left shoulder, not elsewhere classified: Secondary | ICD-10-CM | POA: Diagnosis not present

## 2019-11-03 DIAGNOSIS — M25512 Pain in left shoulder: Secondary | ICD-10-CM | POA: Diagnosis not present

## 2019-11-03 DIAGNOSIS — M542 Cervicalgia: Secondary | ICD-10-CM | POA: Diagnosis not present

## 2019-11-03 DIAGNOSIS — M2560 Stiffness of unspecified joint, not elsewhere classified: Secondary | ICD-10-CM | POA: Diagnosis not present

## 2019-11-03 DIAGNOSIS — M79622 Pain in left upper arm: Secondary | ICD-10-CM | POA: Diagnosis not present

## 2019-11-15 DIAGNOSIS — M79622 Pain in left upper arm: Secondary | ICD-10-CM | POA: Diagnosis not present

## 2019-11-15 DIAGNOSIS — M25512 Pain in left shoulder: Secondary | ICD-10-CM | POA: Diagnosis not present

## 2019-11-15 DIAGNOSIS — R293 Abnormal posture: Secondary | ICD-10-CM | POA: Diagnosis not present

## 2019-11-15 DIAGNOSIS — M2560 Stiffness of unspecified joint, not elsewhere classified: Secondary | ICD-10-CM | POA: Diagnosis not present

## 2019-11-15 DIAGNOSIS — M25612 Stiffness of left shoulder, not elsewhere classified: Secondary | ICD-10-CM | POA: Diagnosis not present

## 2019-11-15 DIAGNOSIS — M542 Cervicalgia: Secondary | ICD-10-CM | POA: Diagnosis not present

## 2019-11-15 DIAGNOSIS — M6281 Muscle weakness (generalized): Secondary | ICD-10-CM | POA: Diagnosis not present

## 2019-11-22 DIAGNOSIS — M6281 Muscle weakness (generalized): Secondary | ICD-10-CM | POA: Diagnosis not present

## 2019-11-22 DIAGNOSIS — M25512 Pain in left shoulder: Secondary | ICD-10-CM | POA: Diagnosis not present

## 2019-11-22 DIAGNOSIS — M79622 Pain in left upper arm: Secondary | ICD-10-CM | POA: Diagnosis not present

## 2019-11-22 DIAGNOSIS — M25612 Stiffness of left shoulder, not elsewhere classified: Secondary | ICD-10-CM | POA: Diagnosis not present

## 2019-11-22 DIAGNOSIS — R293 Abnormal posture: Secondary | ICD-10-CM | POA: Diagnosis not present

## 2019-11-22 DIAGNOSIS — M2560 Stiffness of unspecified joint, not elsewhere classified: Secondary | ICD-10-CM | POA: Diagnosis not present

## 2019-11-22 DIAGNOSIS — M542 Cervicalgia: Secondary | ICD-10-CM | POA: Diagnosis not present

## 2019-11-25 DIAGNOSIS — D510 Vitamin B12 deficiency anemia due to intrinsic factor deficiency: Secondary | ICD-10-CM | POA: Diagnosis not present

## 2019-11-29 DIAGNOSIS — M79622 Pain in left upper arm: Secondary | ICD-10-CM | POA: Diagnosis not present

## 2019-11-29 DIAGNOSIS — M6281 Muscle weakness (generalized): Secondary | ICD-10-CM | POA: Diagnosis not present

## 2019-11-29 DIAGNOSIS — M2560 Stiffness of unspecified joint, not elsewhere classified: Secondary | ICD-10-CM | POA: Diagnosis not present

## 2019-11-29 DIAGNOSIS — M25612 Stiffness of left shoulder, not elsewhere classified: Secondary | ICD-10-CM | POA: Diagnosis not present

## 2019-11-29 DIAGNOSIS — M542 Cervicalgia: Secondary | ICD-10-CM | POA: Diagnosis not present

## 2019-11-29 DIAGNOSIS — M25512 Pain in left shoulder: Secondary | ICD-10-CM | POA: Diagnosis not present

## 2019-11-29 DIAGNOSIS — R293 Abnormal posture: Secondary | ICD-10-CM | POA: Diagnosis not present

## 2019-12-29 DIAGNOSIS — D519 Vitamin B12 deficiency anemia, unspecified: Secondary | ICD-10-CM | POA: Diagnosis not present

## 2020-02-04 DIAGNOSIS — D519 Vitamin B12 deficiency anemia, unspecified: Secondary | ICD-10-CM | POA: Diagnosis not present

## 2020-03-01 DIAGNOSIS — R062 Wheezing: Secondary | ICD-10-CM | POA: Diagnosis not present

## 2020-03-01 DIAGNOSIS — R05 Cough: Secondary | ICD-10-CM | POA: Diagnosis not present

## 2020-03-01 DIAGNOSIS — J189 Pneumonia, unspecified organism: Secondary | ICD-10-CM | POA: Diagnosis not present

## 2020-03-01 DIAGNOSIS — Z20828 Contact with and (suspected) exposure to other viral communicable diseases: Secondary | ICD-10-CM | POA: Diagnosis not present

## 2020-03-01 DIAGNOSIS — R0981 Nasal congestion: Secondary | ICD-10-CM | POA: Diagnosis not present

## 2020-03-22 DIAGNOSIS — D510 Vitamin B12 deficiency anemia due to intrinsic factor deficiency: Secondary | ICD-10-CM | POA: Diagnosis not present

## 2020-04-18 DIAGNOSIS — C44722 Squamous cell carcinoma of skin of right lower limb, including hip: Secondary | ICD-10-CM | POA: Diagnosis not present

## 2020-04-18 DIAGNOSIS — L821 Other seborrheic keratosis: Secondary | ICD-10-CM | POA: Diagnosis not present

## 2020-04-25 DIAGNOSIS — Z23 Encounter for immunization: Secondary | ICD-10-CM | POA: Diagnosis not present

## 2020-04-25 DIAGNOSIS — D519 Vitamin B12 deficiency anemia, unspecified: Secondary | ICD-10-CM | POA: Diagnosis not present

## 2020-05-01 DIAGNOSIS — D500 Iron deficiency anemia secondary to blood loss (chronic): Secondary | ICD-10-CM | POA: Diagnosis not present

## 2020-05-04 DIAGNOSIS — R195 Other fecal abnormalities: Secondary | ICD-10-CM | POA: Diagnosis not present

## 2020-05-04 DIAGNOSIS — D500 Iron deficiency anemia secondary to blood loss (chronic): Secondary | ICD-10-CM | POA: Diagnosis not present

## 2020-05-08 DIAGNOSIS — C44722 Squamous cell carcinoma of skin of right lower limb, including hip: Secondary | ICD-10-CM | POA: Diagnosis not present

## 2020-05-30 DIAGNOSIS — E559 Vitamin D deficiency, unspecified: Secondary | ICD-10-CM | POA: Diagnosis not present

## 2020-07-13 DIAGNOSIS — E559 Vitamin D deficiency, unspecified: Secondary | ICD-10-CM | POA: Diagnosis not present

## 2020-08-03 ENCOUNTER — Other Ambulatory Visit: Payer: Self-pay | Admitting: Family Medicine

## 2020-08-03 DIAGNOSIS — Z1231 Encounter for screening mammogram for malignant neoplasm of breast: Secondary | ICD-10-CM

## 2020-08-17 DIAGNOSIS — D510 Vitamin B12 deficiency anemia due to intrinsic factor deficiency: Secondary | ICD-10-CM | POA: Diagnosis not present

## 2020-09-04 DIAGNOSIS — L720 Epidermal cyst: Secondary | ICD-10-CM | POA: Diagnosis not present

## 2020-09-04 DIAGNOSIS — C44722 Squamous cell carcinoma of skin of right lower limb, including hip: Secondary | ICD-10-CM | POA: Diagnosis not present

## 2020-09-04 DIAGNOSIS — L821 Other seborrheic keratosis: Secondary | ICD-10-CM | POA: Diagnosis not present

## 2020-09-04 DIAGNOSIS — L820 Inflamed seborrheic keratosis: Secondary | ICD-10-CM | POA: Diagnosis not present

## 2020-09-15 ENCOUNTER — Ambulatory Visit: Payer: Medicare PPO

## 2020-09-15 DIAGNOSIS — Z0000 Encounter for general adult medical examination without abnormal findings: Secondary | ICD-10-CM | POA: Diagnosis not present

## 2020-09-15 DIAGNOSIS — Z79899 Other long term (current) drug therapy: Secondary | ICD-10-CM | POA: Diagnosis not present

## 2020-09-15 DIAGNOSIS — E559 Vitamin D deficiency, unspecified: Secondary | ICD-10-CM | POA: Diagnosis not present

## 2020-09-15 DIAGNOSIS — E785 Hyperlipidemia, unspecified: Secondary | ICD-10-CM | POA: Diagnosis not present

## 2020-09-15 DIAGNOSIS — M8580 Other specified disorders of bone density and structure, unspecified site: Secondary | ICD-10-CM | POA: Diagnosis not present

## 2020-09-15 DIAGNOSIS — Z6823 Body mass index (BMI) 23.0-23.9, adult: Secondary | ICD-10-CM | POA: Diagnosis not present

## 2020-09-27 DIAGNOSIS — D510 Vitamin B12 deficiency anemia due to intrinsic factor deficiency: Secondary | ICD-10-CM | POA: Diagnosis not present

## 2020-10-26 ENCOUNTER — Ambulatory Visit
Admission: RE | Admit: 2020-10-26 | Discharge: 2020-10-26 | Disposition: A | Discharge status: Home / Self Care | Payer: Medicare PPO | Source: Ambulatory Visit | Attending: Family Medicine | Admitting: Family Medicine

## 2020-10-26 ENCOUNTER — Other Ambulatory Visit: Payer: Self-pay

## 2020-10-26 DIAGNOSIS — Z1231 Encounter for screening mammogram for malignant neoplasm of breast: Secondary | ICD-10-CM | POA: Diagnosis not present

## 2020-11-28 DIAGNOSIS — D510 Vitamin B12 deficiency anemia due to intrinsic factor deficiency: Secondary | ICD-10-CM | POA: Diagnosis not present

## 2021-01-12 DIAGNOSIS — D510 Vitamin B12 deficiency anemia due to intrinsic factor deficiency: Secondary | ICD-10-CM | POA: Diagnosis not present

## 2021-01-12 DIAGNOSIS — D500 Iron deficiency anemia secondary to blood loss (chronic): Secondary | ICD-10-CM | POA: Diagnosis not present

## 2021-01-16 DIAGNOSIS — R195 Other fecal abnormalities: Secondary | ICD-10-CM | POA: Diagnosis not present

## 2021-01-16 DIAGNOSIS — D500 Iron deficiency anemia secondary to blood loss (chronic): Secondary | ICD-10-CM | POA: Diagnosis not present

## 2021-02-21 DIAGNOSIS — D519 Vitamin B12 deficiency anemia, unspecified: Secondary | ICD-10-CM | POA: Diagnosis not present

## 2021-03-13 DIAGNOSIS — L578 Other skin changes due to chronic exposure to nonionizing radiation: Secondary | ICD-10-CM | POA: Diagnosis not present

## 2021-03-13 DIAGNOSIS — L820 Inflamed seborrheic keratosis: Secondary | ICD-10-CM | POA: Diagnosis not present

## 2021-03-13 DIAGNOSIS — L821 Other seborrheic keratosis: Secondary | ICD-10-CM | POA: Diagnosis not present

## 2021-03-13 DIAGNOSIS — C44722 Squamous cell carcinoma of skin of right lower limb, including hip: Secondary | ICD-10-CM | POA: Diagnosis not present

## 2021-03-27 DIAGNOSIS — D519 Vitamin B12 deficiency anemia, unspecified: Secondary | ICD-10-CM | POA: Diagnosis not present

## 2021-03-27 DIAGNOSIS — Z23 Encounter for immunization: Secondary | ICD-10-CM | POA: Diagnosis not present

## 2021-05-02 DIAGNOSIS — D519 Vitamin B12 deficiency anemia, unspecified: Secondary | ICD-10-CM | POA: Diagnosis not present

## 2021-06-07 DIAGNOSIS — E559 Vitamin D deficiency, unspecified: Secondary | ICD-10-CM | POA: Diagnosis not present

## 2021-06-18 DIAGNOSIS — L739 Follicular disorder, unspecified: Secondary | ICD-10-CM | POA: Diagnosis not present

## 2021-07-07 DIAGNOSIS — Z20828 Contact with and (suspected) exposure to other viral communicable diseases: Secondary | ICD-10-CM | POA: Diagnosis not present

## 2021-07-07 DIAGNOSIS — J029 Acute pharyngitis, unspecified: Secondary | ICD-10-CM | POA: Diagnosis not present

## 2021-07-07 DIAGNOSIS — J019 Acute sinusitis, unspecified: Secondary | ICD-10-CM | POA: Diagnosis not present

## 2021-07-11 DIAGNOSIS — D519 Vitamin B12 deficiency anemia, unspecified: Secondary | ICD-10-CM | POA: Diagnosis not present

## 2021-07-13 DIAGNOSIS — D500 Iron deficiency anemia secondary to blood loss (chronic): Secondary | ICD-10-CM | POA: Diagnosis not present

## 2021-07-19 DIAGNOSIS — Z1212 Encounter for screening for malignant neoplasm of rectum: Secondary | ICD-10-CM | POA: Diagnosis not present

## 2021-07-19 DIAGNOSIS — D500 Iron deficiency anemia secondary to blood loss (chronic): Secondary | ICD-10-CM | POA: Diagnosis not present

## 2021-07-19 DIAGNOSIS — R195 Other fecal abnormalities: Secondary | ICD-10-CM | POA: Diagnosis not present

## 2021-08-15 DIAGNOSIS — D519 Vitamin B12 deficiency anemia, unspecified: Secondary | ICD-10-CM | POA: Diagnosis not present

## 2021-08-24 DIAGNOSIS — Z20828 Contact with and (suspected) exposure to other viral communicable diseases: Secondary | ICD-10-CM | POA: Diagnosis not present

## 2021-08-24 DIAGNOSIS — M791 Myalgia, unspecified site: Secondary | ICD-10-CM | POA: Diagnosis not present

## 2021-08-24 DIAGNOSIS — R5383 Other fatigue: Secondary | ICD-10-CM | POA: Diagnosis not present

## 2021-08-24 DIAGNOSIS — R0981 Nasal congestion: Secondary | ICD-10-CM | POA: Diagnosis not present

## 2021-08-24 DIAGNOSIS — R051 Acute cough: Secondary | ICD-10-CM | POA: Diagnosis not present

## 2021-09-10 DIAGNOSIS — L820 Inflamed seborrheic keratosis: Secondary | ICD-10-CM | POA: Diagnosis not present

## 2021-09-10 DIAGNOSIS — L821 Other seborrheic keratosis: Secondary | ICD-10-CM | POA: Diagnosis not present

## 2021-09-13 DIAGNOSIS — D519 Vitamin B12 deficiency anemia, unspecified: Secondary | ICD-10-CM | POA: Diagnosis not present

## 2021-10-09 DIAGNOSIS — Z79899 Other long term (current) drug therapy: Secondary | ICD-10-CM | POA: Diagnosis not present

## 2021-10-09 DIAGNOSIS — E559 Vitamin D deficiency, unspecified: Secondary | ICD-10-CM | POA: Diagnosis not present

## 2021-10-09 DIAGNOSIS — D519 Vitamin B12 deficiency anemia, unspecified: Secondary | ICD-10-CM | POA: Diagnosis not present

## 2021-10-09 DIAGNOSIS — Z1331 Encounter for screening for depression: Secondary | ICD-10-CM | POA: Diagnosis not present

## 2021-10-09 DIAGNOSIS — Z0000 Encounter for general adult medical examination without abnormal findings: Secondary | ICD-10-CM | POA: Diagnosis not present

## 2021-10-09 DIAGNOSIS — Z6822 Body mass index (BMI) 22.0-22.9, adult: Secondary | ICD-10-CM | POA: Diagnosis not present

## 2021-10-09 DIAGNOSIS — E785 Hyperlipidemia, unspecified: Secondary | ICD-10-CM | POA: Diagnosis not present

## 2021-10-19 DIAGNOSIS — E559 Vitamin D deficiency, unspecified: Secondary | ICD-10-CM | POA: Diagnosis not present

## 2021-11-23 DIAGNOSIS — E559 Vitamin D deficiency, unspecified: Secondary | ICD-10-CM | POA: Diagnosis not present

## 2021-11-29 ENCOUNTER — Other Ambulatory Visit: Payer: Self-pay | Admitting: Family Medicine

## 2021-11-29 DIAGNOSIS — Z1231 Encounter for screening mammogram for malignant neoplasm of breast: Secondary | ICD-10-CM

## 2021-12-06 ENCOUNTER — Ambulatory Visit
Admission: RE | Admit: 2021-12-06 | Discharge: 2021-12-06 | Disposition: A | Discharge status: Home / Self Care | Payer: Medicare PPO | Source: Ambulatory Visit | Attending: Family Medicine | Admitting: Family Medicine

## 2021-12-06 DIAGNOSIS — Z1231 Encounter for screening mammogram for malignant neoplasm of breast: Secondary | ICD-10-CM | POA: Diagnosis not present

## 2021-12-24 DIAGNOSIS — D519 Vitamin B12 deficiency anemia, unspecified: Secondary | ICD-10-CM | POA: Diagnosis not present

## 2021-12-24 IMAGING — MG DIGITAL SCREENING BILAT W/ TOMO W/ CAD
8 series · 9 of 24 positions shown · non-contrast
Comparison: Previous exam(s).

CLINICAL DATA: Screening.

EXAM:
DIGITAL SCREENING BILATERAL MAMMOGRAM WITH TOMO AND CAD

[L MLO synth-2D]
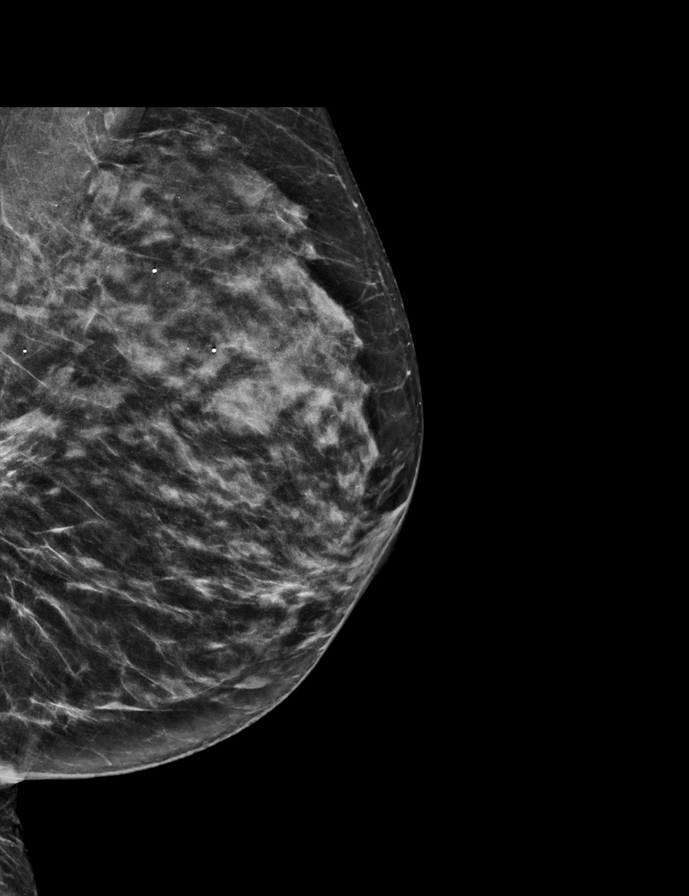

[R MLO synth-2D]
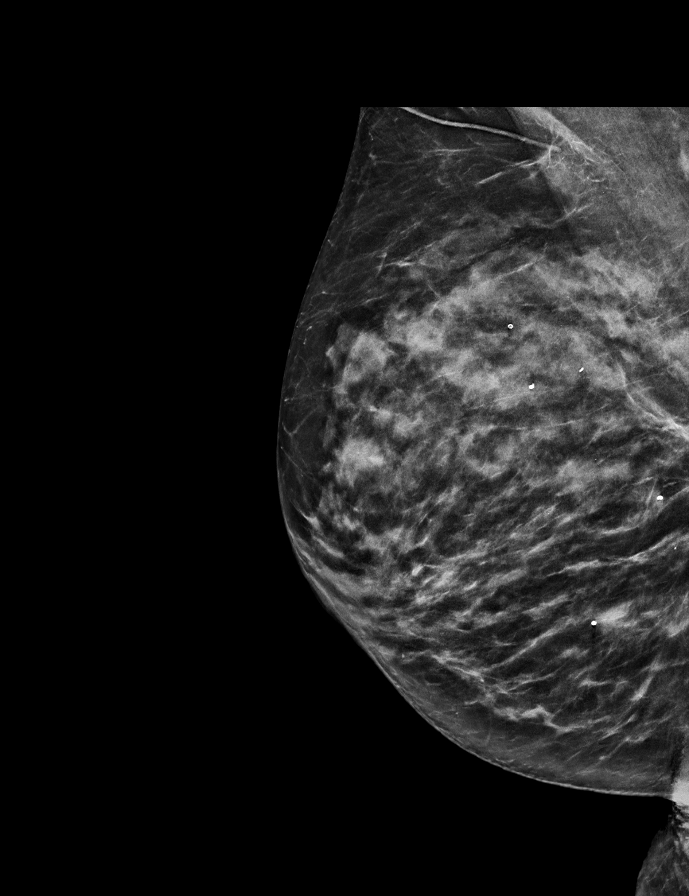

[L CC synth-2D]
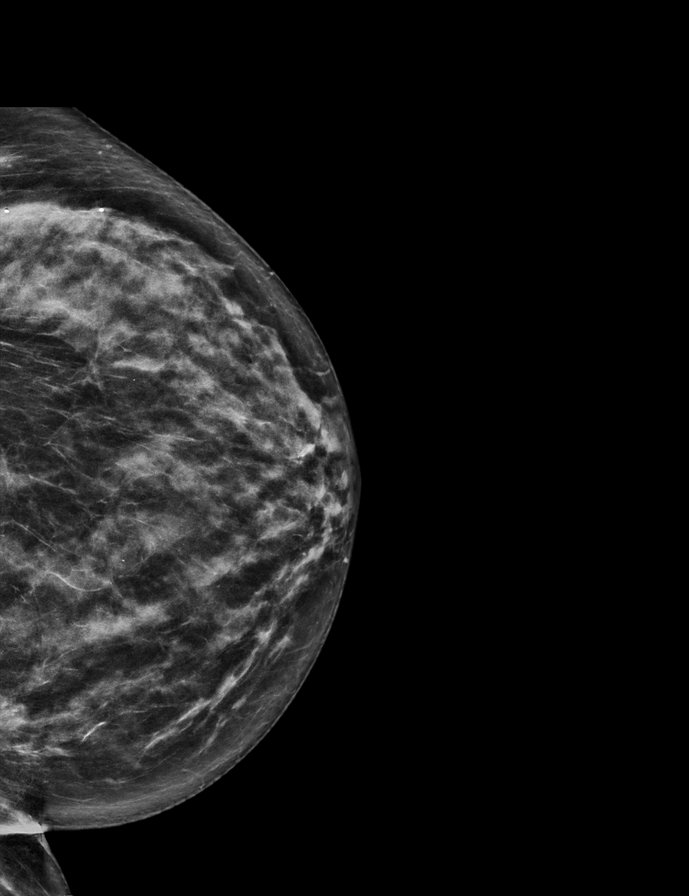

[R CC synth-2D]
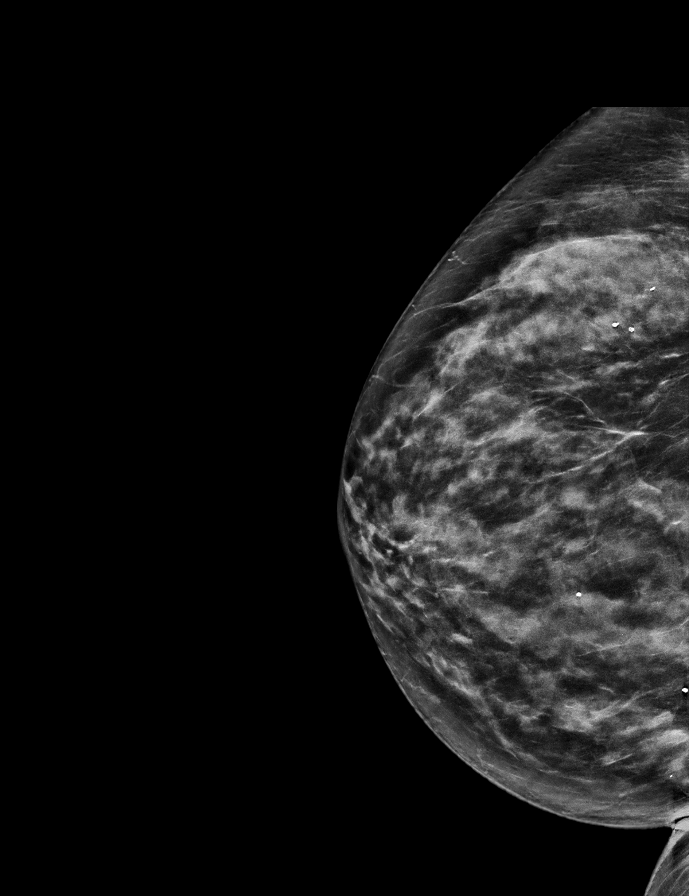

[L CC tomo · 2 of 59 frames shown]
[frame 20/59]
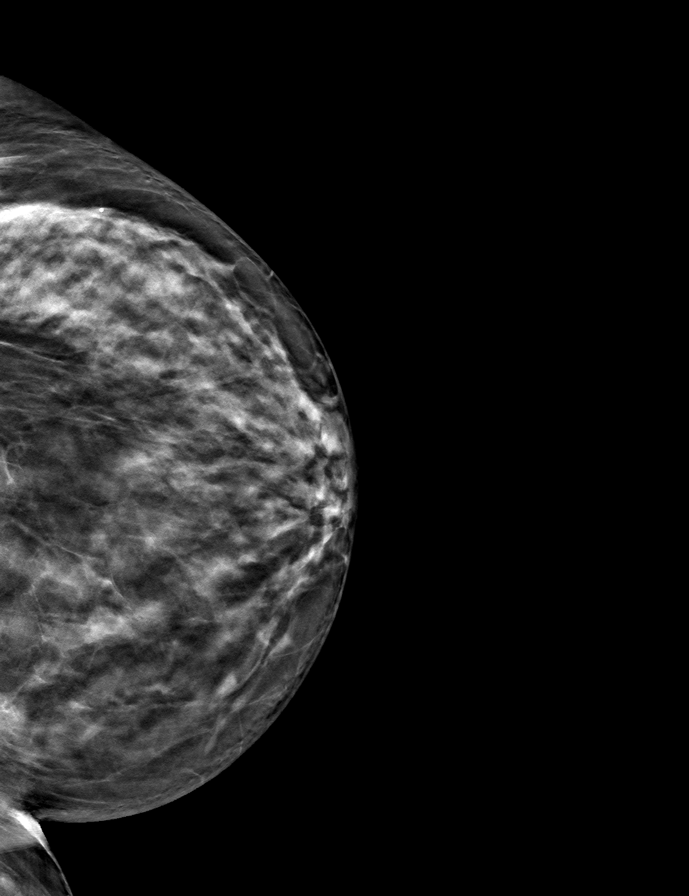
[frame 30/59]
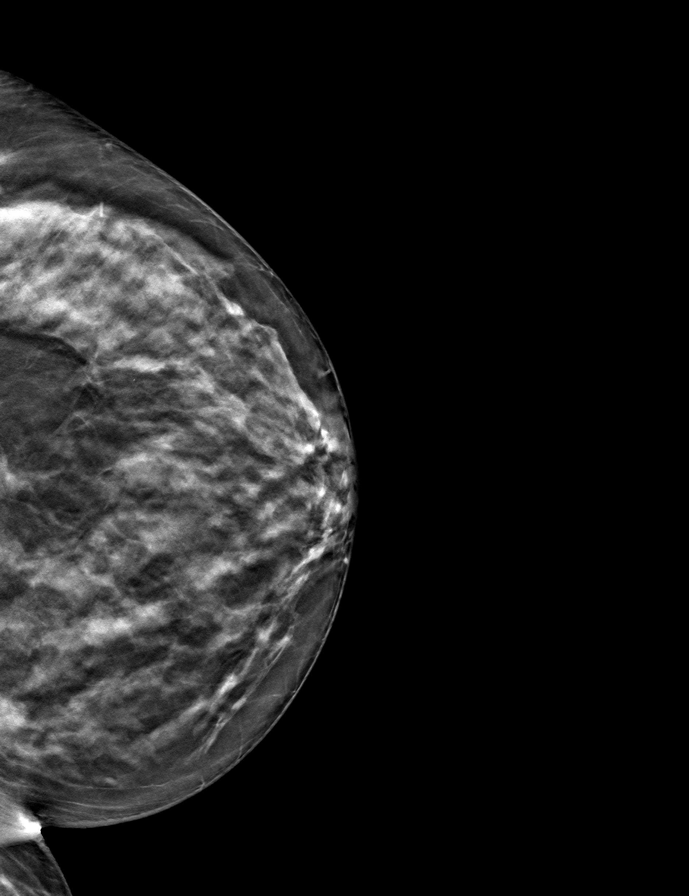

[R MLO tomo · tomo slice 31/60.0]
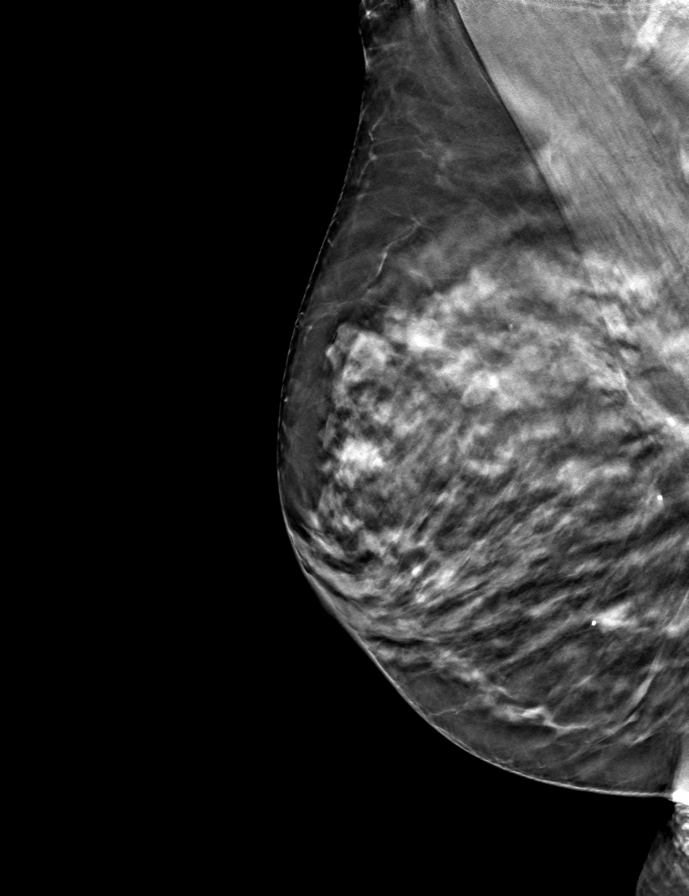

[L MLO tomo · tomo slice 30/59.0]
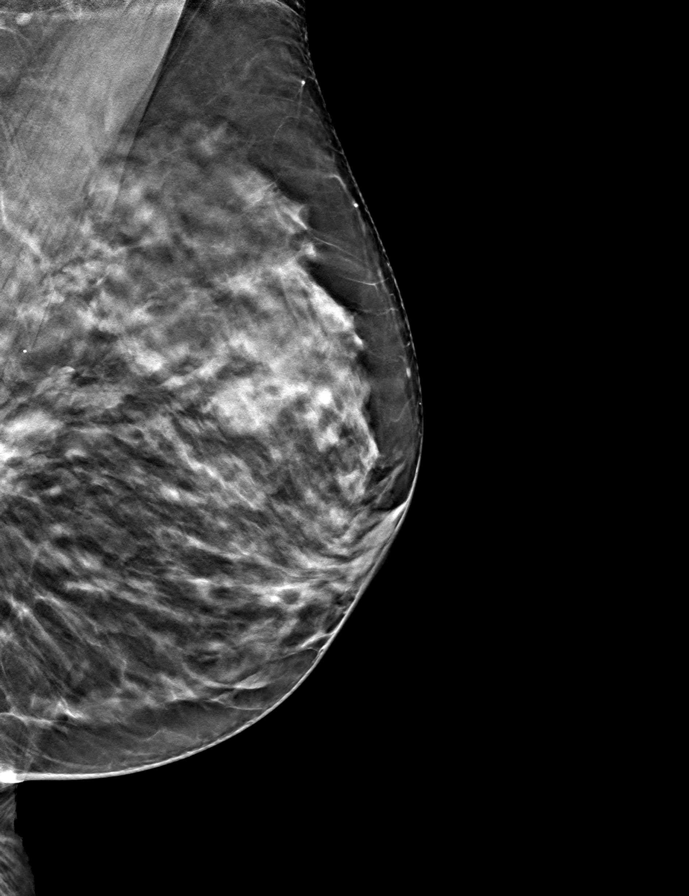

[R CC tomo · tomo slice 32/63.0]
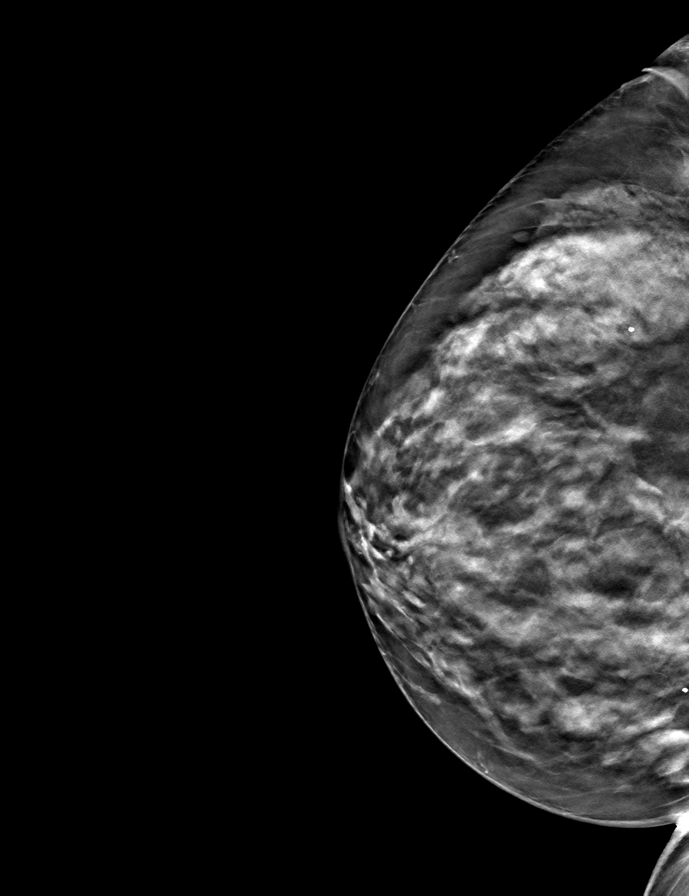

[9 of 24 positions shown; findings below may reference images not displayed]

ACR Breast Density Category c: The breast tissue is heterogeneously
dense, which may obscure small masses.
FINDINGS: There are no findings suspicious for malignancy. Images were
processed with CAD.
IMPRESSION: No mammographic evidence of malignancy. A result letter of this
screening mammogram will be mailed directly to the patient.

RECOMMENDATION:
Screening mammogram in one year. (Code:FT-U-LHB)

BI-RADS CATEGORY  1: Negative.

## 2022-01-10 DIAGNOSIS — D500 Iron deficiency anemia secondary to blood loss (chronic): Secondary | ICD-10-CM | POA: Diagnosis not present

## 2022-01-10 DIAGNOSIS — R195 Other fecal abnormalities: Secondary | ICD-10-CM | POA: Diagnosis not present

## 2022-01-14 DIAGNOSIS — R195 Other fecal abnormalities: Secondary | ICD-10-CM | POA: Diagnosis not present

## 2022-01-14 DIAGNOSIS — D500 Iron deficiency anemia secondary to blood loss (chronic): Secondary | ICD-10-CM | POA: Diagnosis not present

## 2022-01-28 DIAGNOSIS — D510 Vitamin B12 deficiency anemia due to intrinsic factor deficiency: Secondary | ICD-10-CM | POA: Diagnosis not present

## 2022-02-28 DIAGNOSIS — D510 Vitamin B12 deficiency anemia due to intrinsic factor deficiency: Secondary | ICD-10-CM | POA: Diagnosis not present

## 2022-03-18 DIAGNOSIS — L821 Other seborrheic keratosis: Secondary | ICD-10-CM | POA: Diagnosis not present

## 2022-03-18 DIAGNOSIS — L578 Other skin changes due to chronic exposure to nonionizing radiation: Secondary | ICD-10-CM | POA: Diagnosis not present

## 2022-04-10 DIAGNOSIS — Z23 Encounter for immunization: Secondary | ICD-10-CM | POA: Diagnosis not present

## 2022-04-10 DIAGNOSIS — D519 Vitamin B12 deficiency anemia, unspecified: Secondary | ICD-10-CM | POA: Diagnosis not present

## 2022-04-11 DIAGNOSIS — N390 Urinary tract infection, site not specified: Secondary | ICD-10-CM | POA: Diagnosis not present

## 2022-04-11 DIAGNOSIS — Z6824 Body mass index (BMI) 24.0-24.9, adult: Secondary | ICD-10-CM | POA: Diagnosis not present

## 2022-05-02 DIAGNOSIS — H43393 Other vitreous opacities, bilateral: Secondary | ICD-10-CM | POA: Diagnosis not present

## 2022-05-16 DIAGNOSIS — D519 Vitamin B12 deficiency anemia, unspecified: Secondary | ICD-10-CM | POA: Diagnosis not present

## 2022-05-27 DIAGNOSIS — R509 Fever, unspecified: Secondary | ICD-10-CM | POA: Diagnosis not present

## 2022-05-27 DIAGNOSIS — R051 Acute cough: Secondary | ICD-10-CM | POA: Diagnosis not present

## 2022-06-27 DIAGNOSIS — D519 Vitamin B12 deficiency anemia, unspecified: Secondary | ICD-10-CM | POA: Diagnosis not present

## 2022-06-27 DIAGNOSIS — D500 Iron deficiency anemia secondary to blood loss (chronic): Secondary | ICD-10-CM | POA: Diagnosis not present

## 2022-07-03 DIAGNOSIS — D500 Iron deficiency anemia secondary to blood loss (chronic): Secondary | ICD-10-CM | POA: Diagnosis not present

## 2022-07-30 DIAGNOSIS — D519 Vitamin B12 deficiency anemia, unspecified: Secondary | ICD-10-CM | POA: Diagnosis not present

## 2022-09-02 DIAGNOSIS — D519 Vitamin B12 deficiency anemia, unspecified: Secondary | ICD-10-CM | POA: Diagnosis not present

## 2022-09-25 DIAGNOSIS — H02831 Dermatochalasis of right upper eyelid: Secondary | ICD-10-CM | POA: Diagnosis not present

## 2022-09-25 DIAGNOSIS — H02834 Dermatochalasis of left upper eyelid: Secondary | ICD-10-CM | POA: Diagnosis not present

## 2022-10-07 DIAGNOSIS — D519 Vitamin B12 deficiency anemia, unspecified: Secondary | ICD-10-CM | POA: Diagnosis not present

## 2022-10-15 DIAGNOSIS — E7800 Pure hypercholesterolemia, unspecified: Secondary | ICD-10-CM | POA: Diagnosis not present

## 2022-10-15 DIAGNOSIS — M8589 Other specified disorders of bone density and structure, multiple sites: Secondary | ICD-10-CM | POA: Diagnosis not present

## 2022-10-15 DIAGNOSIS — Z0000 Encounter for general adult medical examination without abnormal findings: Secondary | ICD-10-CM | POA: Diagnosis not present

## 2022-10-15 DIAGNOSIS — Z6824 Body mass index (BMI) 24.0-24.9, adult: Secondary | ICD-10-CM | POA: Diagnosis not present

## 2022-10-24 DIAGNOSIS — D519 Vitamin B12 deficiency anemia, unspecified: Secondary | ICD-10-CM | POA: Diagnosis not present

## 2022-11-01 ENCOUNTER — Other Ambulatory Visit: Payer: Self-pay | Admitting: Hematology and Oncology

## 2022-11-01 DIAGNOSIS — D649 Anemia, unspecified: Secondary | ICD-10-CM

## 2022-11-01 DIAGNOSIS — D6489 Other specified anemias: Secondary | ICD-10-CM

## 2022-11-04 ENCOUNTER — Inpatient Hospital Stay: Payer: Medicare PPO

## 2022-11-04 ENCOUNTER — Inpatient Hospital Stay: Payer: Medicare PPO | Attending: Hematology and Oncology | Admitting: Hematology and Oncology

## 2022-11-04 ENCOUNTER — Encounter: Payer: Self-pay | Admitting: Hematology and Oncology

## 2022-11-04 VITALS — BP 131/67 | HR 62 | Temp 98.2°F | Resp 18 | Ht 62.5 in | Wt 130.7 lb

## 2022-11-04 DIAGNOSIS — D72819 Decreased white blood cell count, unspecified: Secondary | ICD-10-CM | POA: Insufficient documentation

## 2022-11-04 DIAGNOSIS — E785 Hyperlipidemia, unspecified: Secondary | ICD-10-CM | POA: Diagnosis not present

## 2022-11-04 DIAGNOSIS — M8580 Other specified disorders of bone density and structure, unspecified site: Secondary | ICD-10-CM | POA: Diagnosis not present

## 2022-11-04 DIAGNOSIS — E559 Vitamin D deficiency, unspecified: Secondary | ICD-10-CM | POA: Diagnosis not present

## 2022-11-04 DIAGNOSIS — D649 Anemia, unspecified: Secondary | ICD-10-CM | POA: Insufficient documentation

## 2022-11-04 DIAGNOSIS — M1990 Unspecified osteoarthritis, unspecified site: Secondary | ICD-10-CM | POA: Insufficient documentation

## 2022-11-04 DIAGNOSIS — D6489 Other specified anemias: Secondary | ICD-10-CM | POA: Diagnosis not present

## 2022-11-04 LAB — RETICULOCYTES
Immature Retic Fract: 9.1 % (ref 2.3–15.9)
RBC.: 3.68 MIL/uL — ABNORMAL LOW (ref 3.87–5.11)
Retic Count, Absolute: 28.7 10*3/uL (ref 19.0–186.0)
Retic Ct Pct: 0.8 % (ref 0.4–3.1)

## 2022-11-04 LAB — CBC WITH DIFFERENTIAL (CANCER CENTER ONLY)
Abs Immature Granulocytes: 0.01 10*3/uL (ref 0.00–0.07)
Basophils Absolute: 0 10*3/uL (ref 0.0–0.1)
Basophils Relative: 1 %
Eosinophils Absolute: 0.1 10*3/uL (ref 0.0–0.5)
Eosinophils Relative: 2 %
HCT: 35.1 % — ABNORMAL LOW (ref 36.0–46.0)
Hemoglobin: 11 g/dL — ABNORMAL LOW (ref 12.0–15.0)
Immature Granulocytes: 0 %
Lymphocytes Relative: 24 %
Lymphs Abs: 1.1 10*3/uL (ref 0.7–4.0)
MCH: 29.6 pg (ref 26.0–34.0)
MCHC: 31.3 g/dL (ref 30.0–36.0)
MCV: 94.6 fL (ref 80.0–100.0)
Monocytes Absolute: 0.4 10*3/uL (ref 0.1–1.0)
Monocytes Relative: 8 %
Neutro Abs: 2.9 10*3/uL (ref 1.7–7.7)
Neutrophils Relative %: 65 %
Platelet Count: 210 10*3/uL (ref 150–400)
RBC: 3.71 MIL/uL — ABNORMAL LOW (ref 3.87–5.11)
RDW: 13.3 % (ref 11.5–15.5)
WBC Count: 4.5 10*3/uL (ref 4.0–10.5)
nRBC: 0 % (ref 0.0–0.2)

## 2022-11-04 LAB — LACTATE DEHYDROGENASE: LDH: 124 U/L (ref 98–192)

## 2022-11-04 LAB — DIRECT ANTIGLOBULIN TEST (NOT AT ARMC)
DAT, IgG: NEGATIVE
DAT, complement: NEGATIVE

## 2022-11-04 LAB — FOLATE: Folate: 37.5 ng/mL (ref >5.9)

## 2022-11-04 NOTE — Progress Notes (Signed)
[facility] [address] [place],  [place]  [address] [phone]  Clinic Day:  11/04/2022  Referring physician: Noni Saupe, MD   REASON FOR CONSULTATION:  Anemia and leukopenia  HISTORY OF PRESENT ILLNESS:  Nicole Horne is a 77 y.o. female with a history of mild anemia and leukopenia who is referred in consultation by Dr. Gwendlyn Deutscher for assessment and management.  She was seen April 9 for well woman check and was found to have white count of 5.5 with normal differential, hemoglobin 10.5 with an MCV of 86 and platelets 192,000. She is on monthly B12 injections and is taking ferrous sulfate daily.. Further evaluation on April 28 revealed WBC 3.3, hemoglobin 10.9 and platelets 206,000. Creatinine was 1.0 and BUN 21. Calculated creatinine clearance is 52 mL/min.  Serum protein electrophoresis did not reveal a monoclonal protein. B12, folate, TSH, AST, ALT and alkaline phosphatase were normal. Review of her records reveals a mild normochromic, normocytic anemia dating back to 2020 with the hemoglobin running 11 to 12.  She reports fatigue, as well as shortness of breath. She denies any overt form of blood loss. She denies pica to ice. Her last colonoscopy was at age 41 and was apparently normal. She states she is due for mammogram now. She denies any family history of anemia or other hematologic disorder.   Medical history: Hyperlipidemia, osteoarthritis, osteopenia, vitamin D deficiency, and anxiety.  Surgical history: Removal of skin cancers, benign biopsy right axilla, tubal ligation, and tonsillectomy.  Social history: She is a former smoker, with 20 pack year history, and quit about 20 years ago.She denies alcohol or illicit drug use. 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   REVIEW OF SYSTEMS:  Review of Systems  Constitutional:  Positive for fatigue. Negative for appetite change, chills, fever and unexpected weight change.   HENT:   Negative for lump/mass, mouth sores and sore throat.   Respiratory:  Positive for shortness of breath. Negative for cough.   Cardiovascular:  Negative for chest pain and leg swelling.  Gastrointestinal:  Negative for abdominal pain, constipation, diarrhea, nausea and vomiting.  Endocrine: Negative for hot flashes.  Genitourinary:  Negative for difficulty urinating, dysuria, frequency and hematuria.   Musculoskeletal:  Negative for arthralgias, back pain and myalgias.  Skin:  Negative for rash.  Neurological:  Negative for dizziness and headaches.  Hematological:  Negative for adenopathy. Does not bruise/bleed easily.  Psychiatric/Behavioral:  Negative for depression and sleep disturbance. The patient is not nervous/anxious.      VITALS:  Blood pressure 131/67, pulse 62, temperature 98.2 F (36.8 C), temperature source Oral, resp. rate 18, height 5' 2.5" (1.588 m), weight 130 lb 11.2 oz (59.3 kg), SpO2 98 %.  Wt Readings from Last 3 Encounters:  11/04/22 130 lb 11.2 oz (59.3 kg)    Body mass index is 23.52 kg/m.  Performance status (ECOG): 1 - Symptomatic but completely ambulatory  PHYSICAL EXAM:  Physical Exam Vitals and nursing note reviewed.  Constitutional:      General: She is not in acute distress.    Appearance: Normal appearance.  HENT:     Head: Normocephalic and atraumatic.     Mouth/Throat:     Mouth: Mucous membranes are moist.     Pharynx: Oropharynx is clear. No oropharyngeal exudate or posterior oropharyngeal erythema.  Eyes:     General: No scleral icterus.    Extraocular Movements: Extraocular movements intact.     Conjunctiva/sclera:  Conjunctivae normal.     Pupils: Pupils are equal, round, and reactive to light.  Cardiovascular:     Rate and Rhythm: Normal rate and regular rhythm.     Heart sounds: Normal heart sounds. No murmur heard.    No friction rub. No gallop.  Pulmonary:     Effort: Pulmonary effort is normal.     Breath sounds: Normal  breath sounds. No wheezing, rhonchi or rales.  Abdominal:     General: There is no distension.     Palpations: Abdomen is soft. There is no mass.     Tenderness: There is no abdominal tenderness.  Musculoskeletal:        General: Normal range of motion.     Cervical back: Normal range of motion and neck supple. No tenderness.     Right lower leg: No edema.     Left lower leg: No edema.  Lymphadenopathy:     Cervical: No cervical adenopathy.  Skin:    General: Skin is warm and dry.     Coloration: Skin is not jaundiced.     Findings: No rash.  Neurological:     Mental Status: She is alert and oriented to person, place, and time.     Cranial Nerves: No cranial nerve deficit.  Psychiatric:        Mood and Affect: Mood normal.        Behavior: Behavior normal.        Thought Content: Thought content normal.      LABS:      Latest Ref Rng & Units 11/04/2022   11:48 AM  CBC  WBC 4.0 - 10.5 K/uL 4.5   Hemoglobin 12.0 - 15.0 g/dL 16.1   Hematocrit 09.6 - 46.0 % 35.1   Platelets 150 - 400 K/uL 210     Lab Results  Component Value Date   LDH 124 11/04/2022    STUDIES:     HISTORY:   Past Medical History:  Diagnosis Date   Anxiety    High cholesterol    Osteoarthritis    Osteopenia    Vitamin B 12 deficiency    Vitamin D deficiency     Past Surgical History:  Procedure Laterality Date   BREAST EXCISIONAL BIOPSY Right    TONSILLECTOMY AND ADENOIDECTOMY     TUBAL LIGATION      Family History  Problem Relation Age of Onset   COPD Mother    Emphysema Mother    COPD Father    Atrial fibrillation Father    Heart failure Brother    Coronary artery disease Brother    Breast cancer Paternal Aunt     Social History:  reports that she has quit smoking. Her smoking use included cigarettes. She has a 20.00 pack-year smoking history. She has never used smokeless tobacco. She reports that she does not drink alcohol and does not use drugs.The patient is alone  today.  Allergies:  Allergies  Allergen Reactions   Codeine Nausea Only and Other (See Comments)    Headaches and trembling   Macrobid [Nitrofurantoin]    Sulfa Antibiotics Nausea Only    Headaches and trembling    Current Medications: Current Outpatient Medications  Medication Sig Dispense Refill   b complex vitamins capsule Take 1 capsule by mouth daily.     Biotin 04540 MCG TABS Take 1 tablet by mouth daily.     diphenhydramine-acetaminophen (TYLENOL PM) 25-500 MG TABS tablet Take 1 tablet by mouth at bedtime as needed.  escitalopram (LEXAPRO) 20 MG tablet      ferrous sulfate 324 MG TBEC Take 324 mg by mouth 2 (two) times daily.     Multiple Vitamin (MULTIVITAMIN WITH MINERALS) TABS tablet Take 1 tablet by mouth daily.     pravastatin (PRAVACHOL) 10 MG tablet TK 1 T PO HS  0   No current facility-administered medications for this visit.     ASSESSMENT & PLAN:   Assessment:  RICKETTA COLANTONIO is a 77 y.o. female with chronic, normochromic, normocytic anemia now with mild leukopenia. The chronic anemia is possibly due to chronic kidney disease.  Plan: Evaluation for other causes of anemia and leukopenia. If no etiology is found and the leukopenia persists, we will proceed with bone marrow evaluation.  I discussed the assessment and plan with the patient.  The patient was provided an opportunity to ask questions and all were answered.  The patient agreed with the plan and demonstrated an understanding of the instructions.  She will see Dr. Angelene Giovanni in 1 to 2 weeks to review the results.  Thank you for the referral.   I provided 30 minutes of face-to-face time during this encounter and > 50% was spent counseling as documented under my assessment and plan. The patient was staffed and plan formulated with Dr. Angelene Giovanni.    Adah Perl, PA-C   Physician Assistant [facility] [place] [phone]

## 2022-11-05 LAB — KAPPA/LAMBDA LIGHT CHAINS
Kappa free light chain: 31.3 mg/L — ABNORMAL HIGH (ref 3.3–19.4)
Kappa, lambda light chain ratio: 0.77 (ref 0.26–1.65)
Lambda free light chains: 40.7 mg/L — ABNORMAL HIGH (ref 5.7–26.3)

## 2022-11-05 LAB — HAPTOGLOBIN: Haptoglobin: 165 mg/dL (ref 42–346)

## 2022-11-06 DIAGNOSIS — D519 Vitamin B12 deficiency anemia, unspecified: Secondary | ICD-10-CM | POA: Diagnosis not present

## 2022-11-06 LAB — COPPER, SERUM: Copper: 89 ug/dL (ref 80–158)

## 2022-11-06 LAB — ZINC: Zinc: 70 ug/dL (ref 44–115)

## 2022-11-08 LAB — MULTIPLE MYELOMA PANEL, SERUM
Albumin SerPl Elph-Mcnc: 3.7 g/dL (ref 2.9–4.4)
Albumin/Glob SerPl: 1.2 (ref 0.7–1.7)
Alpha 1: 0.2 g/dL (ref 0.0–0.4)
Alpha2 Glob SerPl Elph-Mcnc: 0.7 g/dL (ref 0.4–1.0)
B-Globulin SerPl Elph-Mcnc: 1 g/dL (ref 0.7–1.3)
Gamma Glob SerPl Elph-Mcnc: 1.3 g/dL (ref 0.4–1.8)
Globulin, Total: 3.2 g/dL (ref 2.2–3.9)
IgA: 213 mg/dL (ref 64–422)
IgG (Immunoglobin G), Serum: 1118 mg/dL (ref 586–1602)
IgM (Immunoglobulin M), Srm: 310 mg/dL — ABNORMAL HIGH (ref 26–217)
Total Protein ELP: 6.9 g/dL (ref 6.0–8.5)

## 2022-11-13 ENCOUNTER — Inpatient Hospital Stay: Payer: Medicare PPO

## 2022-11-13 ENCOUNTER — Inpatient Hospital Stay: Payer: Medicare PPO | Attending: Hematology and Oncology | Admitting: Oncology

## 2022-11-13 ENCOUNTER — Telehealth: Payer: Self-pay | Admitting: Oncology

## 2022-11-13 VITALS — BP 132/61 | HR 79 | Temp 97.6°F | Resp 14 | Ht 62.5 in | Wt 130.8 lb

## 2022-11-13 DIAGNOSIS — D72819 Decreased white blood cell count, unspecified: Secondary | ICD-10-CM | POA: Diagnosis not present

## 2022-11-13 DIAGNOSIS — D509 Iron deficiency anemia, unspecified: Secondary | ICD-10-CM | POA: Insufficient documentation

## 2022-11-13 DIAGNOSIS — D649 Anemia, unspecified: Secondary | ICD-10-CM | POA: Diagnosis not present

## 2022-11-13 LAB — CBC WITH DIFFERENTIAL/PLATELET
Abs Immature Granulocytes: 0.01 10*3/uL (ref 0.00–0.07)
Basophils Absolute: 0 10*3/uL (ref 0.0–0.1)
Basophils Relative: 1 %
Eosinophils Absolute: 0.1 10*3/uL (ref 0.0–0.5)
Eosinophils Relative: 2 %
HCT: 33.1 % — ABNORMAL LOW (ref 36.0–46.0)
Hemoglobin: 10.6 g/dL — ABNORMAL LOW (ref 12.0–15.0)
Immature Granulocytes: 0 %
Lymphocytes Relative: 26 %
Lymphs Abs: 0.9 10*3/uL (ref 0.7–4.0)
MCH: 30.2 pg (ref 26.0–34.0)
MCHC: 32 g/dL (ref 30.0–36.0)
MCV: 94.3 fL (ref 80.0–100.0)
Monocytes Absolute: 0.3 10*3/uL (ref 0.1–1.0)
Monocytes Relative: 9 %
Neutro Abs: 2.3 10*3/uL (ref 1.7–7.7)
Neutrophils Relative %: 62 %
Platelets: 185 10*3/uL (ref 150–400)
RBC: 3.51 MIL/uL — ABNORMAL LOW (ref 3.87–5.11)
RDW: 13.4 % (ref 11.5–15.5)
WBC: 3.6 10*3/uL — ABNORMAL LOW (ref 4.0–10.5)
nRBC: 0 % (ref 0.0–0.2)

## 2022-11-13 NOTE — Telephone Encounter (Signed)
Patient has been scheduled. Aware of appt date and time    Message Received: Today Arville Care, CMA sent to [employer] Scheduling 6 month follow up with labs

## 2022-11-13 NOTE — Progress Notes (Signed)
[facility] Cancer Follow up Visit:  Patient Care Team: Noni Saupe, MD as PCP - General (Family Medicine)  CHIEF COMPLAINTS/PURPOSE OF CONSULTATION: HISTORY OF PRESENTING ILLNESS: Nicole Horne 77 y.o. female is here because of  cytopenias Medical history notable for hyperlipidemia, osteoarthritis, osteopenia, vitamin D deficiency, nonmelanoma to skin cancers, benign biopsy, remote tobacco use  October 24, 2022: WBC 3.5 hemoglobin 10.5 MCV 86 platelet count 192; 73 segs 21 lymphs 6 mid ferritin 121  November 04, 2022 WBC 4.5 hemoglobin 11.0 MCV 95 platelet count 210; 65 segs 24 lymphs 8 monos 2 eos 1 basophil reticulocyte count 0.8% DAT negative haptoglobin 165 LDH 124 SPEP with IEP showed no paraprotein serum free kappa 31.3 lambda 40.7 kappa lambda 0.77 IgG 1118 IgA 213 IgM elevated at 310 Folate 37.5 copper 89 zinc 70  Nov 12 2022:  Follow up regarding cytopenias  Reviewed results of labs with patient.   Last colonoscopy was at 76 yrs of age and was negative per patient.  Underwent an EGD at the time which was negative per patient.  Patient taking two doses of iron sulfate 325 mg weekly.    Review of Systems - Oncology  MEDICAL HISTORY: Past Medical History:  Diagnosis Date   Anxiety    High cholesterol    Osteoarthritis    Osteopenia    Vitamin B 12 deficiency    Vitamin D deficiency     SURGICAL HISTORY: Past Surgical History:  Procedure Laterality Date   BREAST EXCISIONAL BIOPSY Right    TONSILLECTOMY AND ADENOIDECTOMY     TUBAL LIGATION      SOCIAL HISTORY: Social History   Socioeconomic History   Marital status: Married    Spouse name: Not on file   Number of children: 2   Years of education: Not on file   Highest education level: Associate degree: occupational, Scientist, product/process development, or vocational program  Occupational History   Not on file  Tobacco Use   Smoking status: Former    Packs/day: 1.00    Years: 20.00    Additional pack years: 0.00     Total pack years: 20.00    Types: Cigarettes   Smokeless tobacco: Never  Vaping Use   Vaping Use: Never used  Substance and Sexual Activity   Alcohol use: Never   Drug use: Never   Sexual activity: Not on file  Other Topics Concern   Not on file  Social History Narrative   Not on file   Social Determinants of Health   Financial Resource Strain: Not on file  Food Insecurity: No Food Insecurity (11/04/2022)   Hunger Vital Sign    Worried About Running Out of Food in the Last Year: Never true    Ran Out of Food in the Last Year: Never true  Transportation Needs: No Transportation Needs (11/04/2022)   PRAPARE - Administrator, Civil Service (Medical): No    Lack of Transportation (Non-Medical): No  Physical Activity: Not on file  Stress: Not on file  Social Connections: Not on file  Intimate Partner Violence: Not At Risk (11/04/2022)   Humiliation, Afraid, Rape, and Kick questionnaire    Fear of Current or Ex-Partner: No    Emotionally Abused: No    Physically Abused: No    Sexually Abused: No    FAMILY HISTORY Family History  Problem Relation Age of Onset   COPD Mother    Emphysema Mother    COPD Father  Atrial fibrillation Father    Heart failure Brother    Coronary artery disease Brother    Breast cancer Paternal Aunt     ALLERGIES:  is allergic to codeine, macrobid [nitrofurantoin], and sulfa antibiotics.  MEDICATIONS:  Current Outpatient Medications  Medication Sig Dispense Refill   b complex vitamins capsule Take 1 capsule by mouth daily.     Biotin 96295 MCG TABS Take 1 tablet by mouth daily.     diphenhydramine-acetaminophen (TYLENOL PM) 25-500 MG TABS tablet Take 1 tablet by mouth at bedtime as needed.     escitalopram (LEXAPRO) 20 MG tablet      ferrous sulfate 324 MG TBEC Take 324 mg by mouth 2 (two) times daily.     Multiple Vitamin (MULTIVITAMIN WITH MINERALS) TABS tablet Take 1 tablet by mouth daily.     pravastatin (PRAVACHOL) 10 MG  tablet TK 1 T PO HS  0   No current facility-administered medications for this visit.    PHYSICAL EXAMINATION:  ECOG PERFORMANCE STATUS: 0 - Asymptomatic   Vitals:   11/13/22 0857  BP: 132/61  Pulse: 79  Resp: 14  Temp: 97.6 F (36.4 C)  SpO2: 92%    Filed Weights   11/13/22 0857  Weight: 130 lb 12.8 oz (59.3 kg)     Physical Exam Vitals and nursing note reviewed.  Constitutional:      General: She is not in acute distress.    Appearance: Normal appearance. She is normal weight. She is not ill-appearing, toxic-appearing or diaphoretic.  HENT:     Head: Normocephalic and atraumatic.     Right Ear: External ear normal.     Left Ear: External ear normal.     Nose: Nose normal. No congestion or rhinorrhea.  Eyes:     General: No scleral icterus.    Extraocular Movements: Extraocular movements intact.     Conjunctiva/sclera: Conjunctivae normal.     Pupils: Pupils are equal, round, and reactive to light.  Cardiovascular:     Rate and Rhythm: Normal rate and regular rhythm.     Heart sounds: No murmur heard.    No friction rub. No gallop.  Pulmonary:     Effort: Pulmonary effort is normal.     Breath sounds: Normal breath sounds. No wheezing, rhonchi or rales.  Abdominal:     General: Bowel sounds are normal.     Palpations: Abdomen is soft.  Musculoskeletal:        General: No swelling, tenderness or deformity.     Cervical back: Normal range of motion and neck supple. No rigidity or tenderness.     Right lower leg: No edema.     Left lower leg: No edema.  Lymphadenopathy:     Head:     Right side of head: No submental, submandibular, tonsillar, preauricular, posterior auricular or occipital adenopathy.     Left side of head: No submental, submandibular, tonsillar, preauricular, posterior auricular or occipital adenopathy.     Cervical: No cervical adenopathy.     Right cervical: No superficial, deep or posterior cervical adenopathy.    Left cervical: No  superficial, deep or posterior cervical adenopathy.     Upper Body:     Right upper body: No supraclavicular, axillary, pectoral or epitrochlear adenopathy.     Left upper body: No supraclavicular, axillary, pectoral or epitrochlear adenopathy.  Skin:    General: Skin is warm.     Coloration: Skin is not jaundiced or pale.  Neurological:  General: No focal deficit present.     Mental Status: She is alert and oriented to person, place, and time.     Cranial Nerves: No cranial nerve deficit.  Psychiatric:        Mood and Affect: Mood normal.        Behavior: Behavior normal.        Thought Content: Thought content normal.        Judgment: Judgment normal.      LABORATORY DATA: I have personally reviewed the data as listed:  Office Visit on 11/13/2022  Component Date Value Ref Range Status   WBC 11/13/2022 3.6 (L)  4.0 - 10.5 K/uL Final   RBC 11/13/2022 3.51 (L)  3.87 - 5.11 MIL/uL Final   Hemoglobin 11/13/2022 10.6 (L)  12.0 - 15.0 g/dL Final   HCT 16/04/9603 33.1 (L)  36.0 - 46.0 % Final   MCV 11/13/2022 94.3  80.0 - 100.0 fL Final   MCH 11/13/2022 30.2  26.0 - 34.0 pg Final   MCHC 11/13/2022 32.0  30.0 - 36.0 g/dL Final   RDW 54/03/8118 13.4  11.5 - 15.5 % Final   Platelets 11/13/2022 185  150 - 400 K/uL Final   nRBC 11/13/2022 0.0  0.0 - 0.2 % Final   Neutrophils Relative % 11/13/2022 62  % Final   Neutro Abs 11/13/2022 2.3  1.7 - 7.7 K/uL Final   Lymphocytes Relative 11/13/2022 26  % Final   Lymphs Abs 11/13/2022 0.9  0.7 - 4.0 K/uL Final   Monocytes Relative 11/13/2022 9  % Final   Monocytes Absolute 11/13/2022 0.3  0.1 - 1.0 K/uL Final   Eosinophils Relative 11/13/2022 2  % Final   Eosinophils Absolute 11/13/2022 0.1  0.0 - 0.5 K/uL Final   Basophils Relative 11/13/2022 1  % Final   Basophils Absolute 11/13/2022 0.0  0.0 - 0.1 K/uL Final   Immature Granulocytes 11/13/2022 0  % Final   Abs Immature Granulocytes 11/13/2022 0.01  0.00 - 0.07 K/uL Final   Performed  at [facility], [address], [place], [place] [address]  Appointment on 11/04/2022  Component Date Value Ref Range Status   LDH 11/04/2022 124  98 - 192 U/L Final   Performed at [facility], [address] [place], [place] [address]   IgG (Immunoglobin G), Serum 11/04/2022 1,118  586 - 1,602 mg/dL Final   IgA 30/86/5784 213  64 - 422 mg/dL Final   IgM (Immunoglobulin M), Srm 11/04/2022 310 (H)  26 - 217 mg/dL Final   Total Protein ELP 11/04/2022 6.9  6.0 - 8.5 g/dL Corrected   Albumin SerPl Elph-Mcnc 11/04/2022 3.7  2.9 - 4.4 g/dL Corrected   Alpha 1 69/62/9528 0.2  0.0 - 0.4 g/dL Corrected   Alpha2 Glob SerPl Elph-Mcnc 11/04/2022 0.7  0.4 - 1.0 g/dL Corrected   B-Globulin SerPl Elph-Mcnc 11/04/2022 1.0  0.7 - 1.3 g/dL Corrected   Gamma Glob SerPl Elph-Mcnc 11/04/2022 1.3  0.4 - 1.8 g/dL Corrected   M Protein SerPl Elph-Mcnc 11/04/2022 Not Observed  Not Observed g/dL Corrected   Globulin, Total 11/04/2022 3.2  2.2 - 3.9 g/dL Corrected   Albumin/Glob SerPl 11/04/2022 1.2  0.7 - 1.7 Corrected   IFE 1 11/04/2022 Comment (A)   Corrected   Polyclonal increase detected in one or more immunoglobulins.   Please Note 11/04/2022 Comment   Corrected   Comment: (NOTE) Protein electrophoresis scan will follow via computer, mail, or courier delivery. Performed  At: [facility] [address] [place], [place] [id number] Jolene Schimke MD WU:[id number]    Kappa free light chain 11/04/2022 31.3 (H)  3.3 - 19.4 mg/L Final   Lambda free light chains 11/04/2022 40.7 (H)  5.7 - 26.3 mg/L Final   Kappa, lambda light chain ratio 11/04/2022 0.77  0.26 - 1.65 Final   Comment: (NOTE) Performed At: [facility] [address] [place], [place] [id number] Jolene Schimke MD VH:[id number]    WBC Count 11/04/2022 4.5  4.0 - 10.5 K/uL Final   RBC 11/04/2022 3.71 (L)  3.87 - 5.11 MIL/uL Final   Hemoglobin 11/04/2022 11.0 (L)  12.0 - 15.0 g/dL Final    HCT 41/32/4401 35.1 (L)  36.0 - 46.0 % Final   MCV 11/04/2022 94.6  80.0 - 100.0 fL Final   MCH 11/04/2022 29.6  26.0 - 34.0 pg Final   MCHC 11/04/2022 31.3  30.0 - 36.0 g/dL Final   RDW 02/72/5366 13.3  11.5 - 15.5 % Final   Platelet Count 11/04/2022 210  150 - 400 K/uL Final   nRBC 11/04/2022 0.0  0.0 - 0.2 % Final   Neutrophils Relative % 11/04/2022 65  % Final   Neutro Abs 11/04/2022 2.9  1.7 - 7.7 K/uL Final   Lymphocytes Relative 11/04/2022 24  % Final   Lymphs Abs 11/04/2022 1.1  0.7 - 4.0 K/uL Final   Monocytes Relative 11/04/2022 8  % Final   Monocytes Absolute 11/04/2022 0.4  0.1 - 1.0 K/uL Final   Eosinophils Relative 11/04/2022 2  % Final   Eosinophils Absolute 11/04/2022 0.1  0.0 - 0.5 K/uL Final   Basophils Relative 11/04/2022 1  % Final   Basophils Absolute 11/04/2022 0.0  0.0 - 0.1 K/uL Final   Immature Granulocytes 11/04/2022 0  % Final   Abs Immature Granulocytes 11/04/2022 0.01  0.00 - 0.07 K/uL Final   Performed at [facility], [address] [place], [place] [address]   DAT, complement 11/04/2022 NEG   Final   DAT, IgG 11/04/2022    Final                   Value:NEG Performed at [facility], [address], [place], [place] [address]    Copper 11/04/2022 89  80 - 158 ug/dL Final   Comment: (NOTE) This test was developed and its performance characteristics determined by [facility]. It has not been cleared or approved by the Food and Drug Administration.                                Detection Limit = 5 Performed At: [facility] [address] [place], [place] [id number] Jolene Schimke MD PI:[id number]    Zinc 11/04/2022 70  44 - 115 ug/dL Final   Comment: (NOTE) This test was developed and its performance characteristics determined by [facility]. It has not been cleared or approved by the Food and Drug Administration.                                Detection Limit = 5 Performed At: [facility] [address] [place], [place] [id number] Jolene Schimke MD NA:[id number]    Retic Ct Pct 11/04/2022 0.8  0.4 - 3.1 % Final   RBC. 11/04/2022 3.68 (L)  3.87 - 5.11 MIL/uL Final   Retic Count, Absolute  11/04/2022 28.7  19.0 - 186.0 K/uL Final   Immature Retic Fract 11/04/2022 9.1  2.3 - 15.9 % Final   Performed at [facility], [address] [place], [place] [address]   Folate 11/04/2022 37.5  >5.9 ng/mL Final   Comment: RESULT CONFIRMED BY MANUAL DILUTION Performed at [facility], [address], [place], [place] [address]    Haptoglobin 11/04/2022 165  42 - 346 mg/dL Final   Comment: (NOTE) Performed At: [facility] [address] [place], [place] [id number] Jolene Schimke MD WG:[id number]     RADIOGRAPHIC STUDIES: I have personally reviewed the radiological images as listed and agree with the findings in the report  No results found.  ASSESSMENT/PLAN  77 y.o. female is here because of  cytopenias.  Medical history notable for hyperlipidemia, osteoarthritis, osteopenia, vitamin D deficiency, nonmelanoma to skin cancers, benign biopsy, remote tobacco use  Anemia:  Mild and likely multifactorial  Nov 13 2022:  Likely culprits are mild CKD and iron deficiency from occult GI blood loss.  She is followed by GI regarding the iron deficiency portion of anemia. Currently on oral iron twice weekly.  Has undergone endoscopy in the past  Leukopenia Nov 13 2022- Mild and intermittent.  No evidence of nutritional disorder or myeloma.  No history of recurrent infections or s/sx of autoimmune disease. Will continue to follow periodic blood counts  Will obtain CBC with diff today to help decide if more indepth evaluation needed.       Cancer Staging  No matching staging information was found for the patient.   No problem-specific Assessment & Plan notes found for this encounter.    Orders Placed This Encounter  Procedures   CBC with  Differential/Platelet   Obtain medical records    Please obtain records from Dr. Charm Barges (GI) to include endoscopy records Thanks    30  minutes was spent in patient care.  This included time spent preparing to see the patient (e.g., review of tests), obtaining and/or reviewing separately obtained history, counseling and educating the patient/family/caregiver, ordering medications, tests, or procedures; documenting clinical information in the electronic or other health record, independently interpreting results and communicating results to the patient/family/caregiver as well as coordination of care.       All questions were answered. The patient knows to call the clinic with any problems, questions or concerns.  This note was electronically signed.    Loni Muse, MD  11/13/2022 4:27 PM

## 2022-11-14 ENCOUNTER — Telehealth: Payer: Self-pay

## 2022-11-14 DIAGNOSIS — D500 Iron deficiency anemia secondary to blood loss (chronic): Secondary | ICD-10-CM | POA: Diagnosis not present

## 2022-11-14 NOTE — Telephone Encounter (Signed)
Pt req her labs and office note from yesterday to be faxed to Dr Charm Barges.

## 2022-11-28 ENCOUNTER — Other Ambulatory Visit: Payer: Self-pay | Admitting: Family Medicine

## 2022-11-28 DIAGNOSIS — Z1231 Encounter for screening mammogram for malignant neoplasm of breast: Secondary | ICD-10-CM

## 2022-12-16 ENCOUNTER — Other Ambulatory Visit: Payer: Medicare PPO

## 2022-12-16 ENCOUNTER — Other Ambulatory Visit: Payer: Self-pay

## 2022-12-16 ENCOUNTER — Inpatient Hospital Stay: Payer: Medicare PPO | Attending: Hematology and Oncology

## 2022-12-16 DIAGNOSIS — D509 Iron deficiency anemia, unspecified: Secondary | ICD-10-CM | POA: Insufficient documentation

## 2022-12-16 DIAGNOSIS — D72819 Decreased white blood cell count, unspecified: Secondary | ICD-10-CM

## 2022-12-16 DIAGNOSIS — D649 Anemia, unspecified: Secondary | ICD-10-CM

## 2022-12-16 LAB — CBC WITH DIFFERENTIAL (CANCER CENTER ONLY)
Abs Immature Granulocytes: 0.01 10*3/uL (ref 0.00–0.07)
Basophils Absolute: 0 10*3/uL (ref 0.0–0.1)
Basophils Relative: 1 %
Eosinophils Absolute: 0.1 10*3/uL (ref 0.0–0.5)
Eosinophils Relative: 2 %
HCT: 34.5 % — ABNORMAL LOW (ref 36.0–46.0)
Hemoglobin: 10.8 g/dL — ABNORMAL LOW (ref 12.0–15.0)
Immature Granulocytes: 0 %
Lymphocytes Relative: 26 %
Lymphs Abs: 1.1 10*3/uL (ref 0.7–4.0)
MCH: 30 pg (ref 26.0–34.0)
MCHC: 31.3 g/dL (ref 30.0–36.0)
MCV: 95.8 fL (ref 80.0–100.0)
Monocytes Absolute: 0.3 10*3/uL (ref 0.1–1.0)
Monocytes Relative: 7 %
Neutro Abs: 2.8 10*3/uL (ref 1.7–7.7)
Neutrophils Relative %: 64 %
Platelet Count: 206 10*3/uL (ref 150–400)
RBC: 3.6 MIL/uL — ABNORMAL LOW (ref 3.87–5.11)
RDW: 13.1 % (ref 11.5–15.5)
WBC Count: 4.4 10*3/uL (ref 4.0–10.5)
nRBC: 0 % (ref 0.0–0.2)

## 2022-12-20 ENCOUNTER — Telehealth: Payer: Self-pay

## 2022-12-20 NOTE — Telephone Encounter (Signed)
Patient left message she needs her , recent lab results could you review and let me know thanks.

## 2022-12-25 ENCOUNTER — Ambulatory Visit
Admission: RE | Admit: 2022-12-25 | Discharge: 2022-12-25 | Disposition: A | Discharge status: Home / Self Care | Payer: Medicare PPO | Source: Ambulatory Visit | Attending: Family Medicine | Admitting: Family Medicine

## 2022-12-25 DIAGNOSIS — Z1231 Encounter for screening mammogram for malignant neoplasm of breast: Secondary | ICD-10-CM

## 2023-05-14 ENCOUNTER — Inpatient Hospital Stay: Payer: Medicare PPO | Attending: Oncology | Admitting: Oncology

## 2023-05-14 ENCOUNTER — Inpatient Hospital Stay: Payer: Medicare PPO

## 2023-05-14 VITALS — BP 117/72 | HR 61 | Temp 98.7°F | Resp 14 | Wt 137.8 lb

## 2023-05-14 DIAGNOSIS — D509 Iron deficiency anemia, unspecified: Secondary | ICD-10-CM | POA: Insufficient documentation

## 2023-05-14 DIAGNOSIS — M1990 Unspecified osteoarthritis, unspecified site: Secondary | ICD-10-CM | POA: Insufficient documentation

## 2023-05-14 DIAGNOSIS — Z85828 Personal history of other malignant neoplasm of skin: Secondary | ICD-10-CM | POA: Insufficient documentation

## 2023-05-14 DIAGNOSIS — D72810 Lymphocytopenia: Secondary | ICD-10-CM | POA: Insufficient documentation

## 2023-05-14 DIAGNOSIS — D649 Anemia, unspecified: Secondary | ICD-10-CM

## 2023-05-14 DIAGNOSIS — M8580 Other specified disorders of bone density and structure, unspecified site: Secondary | ICD-10-CM | POA: Insufficient documentation

## 2023-05-14 DIAGNOSIS — E559 Vitamin D deficiency, unspecified: Secondary | ICD-10-CM | POA: Diagnosis not present

## 2023-05-14 LAB — CBC WITH DIFFERENTIAL (CANCER CENTER ONLY)
Abs Immature Granulocytes: 0.02 10*3/uL (ref 0.00–0.07)
Basophils Absolute: 0 10*3/uL (ref 0.0–0.1)
Basophils Relative: 1 %
Eosinophils Absolute: 0.1 10*3/uL (ref 0.0–0.5)
Eosinophils Relative: 2 %
HCT: 32.9 % — ABNORMAL LOW (ref 36.0–46.0)
Hemoglobin: 10.9 g/dL — ABNORMAL LOW (ref 12.0–15.0)
Immature Granulocytes: 0 %
Lymphocytes Relative: 28 %
Lymphs Abs: 1.4 10*3/uL (ref 0.7–4.0)
MCH: 31 pg (ref 26.0–34.0)
MCHC: 33.1 g/dL (ref 30.0–36.0)
MCV: 93.5 fL (ref 80.0–100.0)
Monocytes Absolute: 0.4 10*3/uL (ref 0.1–1.0)
Monocytes Relative: 7 %
Neutro Abs: 3.1 10*3/uL (ref 1.7–7.7)
Neutrophils Relative %: 62 %
Platelet Count: 195 10*3/uL (ref 150–400)
RBC: 3.52 MIL/uL — ABNORMAL LOW (ref 3.87–5.11)
RDW: 12.7 % (ref 11.5–15.5)
WBC Count: 5.1 10*3/uL (ref 4.0–10.5)
nRBC: 0 % (ref 0.0–0.2)
nRBC: 0 /100{WBCs}

## 2023-05-14 LAB — FOLATE: Folate: >40 ng/mL (ref >5.9)

## 2023-05-14 LAB — VITAMIN B12: Vitamin B-12: 680 pg/mL (ref 180–914)

## 2023-05-14 LAB — FERRITIN: Ferritin: 66 ng/mL (ref 11–307)

## 2023-05-14 NOTE — Progress Notes (Signed)
[facility] Cancer Follow up Visit:  Patient Care Team: Annamaria Helling, DO as PCP - General (Family Medicine)  CHIEF COMPLAINTS/PURPOSE OF CONSULTATION: HISTORY OF PRESENTING ILLNESS: Nicole Horne 77 y.o. 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   serum free kappa 31.3 lambda 40.7 kappa lambda 0.77 IgG 1118 IgA 213 IgM elevated at 310 Folate 37.5 copper 89 zinc 70  Nov 12 2022:  Follow up regarding cytopenias  Reviewed results of labs with patient.   Last colonoscopy was at 24 yrs of age and was negative per patient.  Underwent an EGD at the time which was negative per patient.  Patient taking two doses of iron sulfate 325 mg weekly.    December 16, 2022: WBC 4.4 hemoglobin 10.8 MCV 96 platelet count 206; 64 segs 26 lymphs 7 monos 2 eos 1 Baso  December 25, 2022: Bilateral screening mammogram--BI-RADS 1  May 14 2023:  Scheduled follow up regarding cytopenia.  Reviewed results of mammogram with patient.  Takes low dose iron daily which Nicole Horne tolerates well.  No arthralgias or myalgias WBC 5.1 Hgb 10.9 hemoglobin 195; 62 segs 28 lymphs 7 monos 2 eos 1 Baso  ferritin 66 Folate > 40 B12 680  Review of Systems - Oncology  MEDICAL HISTORY: Past Medical History:  Diagnosis Date   Anxiety    High cholesterol    Osteoarthritis    Osteopenia    Vitamin B 12 deficiency    Vitamin D deficiency     SURGICAL HISTORY: Past Surgical History:  Procedure Laterality Date    BREAST EXCISIONAL BIOPSY Right    TONSILLECTOMY AND ADENOIDECTOMY     TUBAL LIGATION      SOCIAL HISTORY: Social History   Socioeconomic History   Marital status: Married    Spouse name: Not on file   Number of children: 2   Years of education: Not on file   Highest education level: Associate degree: occupational, Scientist, product/process development, or vocational program  Occupational History   Not on file  Tobacco Use   Smoking status: Former    Current packs/day: 1.00    Average packs/day: 1 pack/day for 20.0 years (20.0 ttl pk-yrs)    Types: Cigarettes   Smokeless tobacco: Never  Vaping Use   Vaping status: Never Used  Substance and Sexual Activity   Alcohol use: Never   Drug use: Never   Sexual activity: Not on file  Other Topics Concern   Not on file  Social History Narrative   Not on file   Social Determinants of Health   Financial Resource Strain: Not on file  Food Insecurity: No Food Insecurity (11/04/2022)   Hunger Vital Sign    Worried About Running Out of Food in the Last Year: Never true    Ran Out of Food in the Last Year: Never true  Transportation Needs: No Transportation Needs (11/04/2022)   PRAPARE - Administrator, Civil Service (Medical): No    Lack of Transportation (Non-Medical): No  Physical  Activity: Not on file  Stress: Not on file  Social Connections: Not on file  Intimate Partner Violence: Not At Risk (11/04/2022)   Humiliation, Afraid, Rape, and Kick questionnaire    Fear of Current or Ex-Partner: No    Emotionally Abused: No    Physically Abused: No    Sexually Abused: No    FAMILY HISTORY Family History  Problem Relation Age of Onset   COPD Mother    Emphysema Mother    COPD Father    Atrial fibrillation Father    Heart failure Brother    Coronary artery disease Brother    Breast cancer Paternal Aunt     ALLERGIES:  is allergic to codeine, macrobid [nitrofurantoin], and sulfa antibiotics.  MEDICATIONS:  Current Outpatient Medications   Medication Sig Dispense Refill   b complex vitamins capsule Take 1 capsule by mouth daily.     Biotin 21308 MCG TABS Take 1 tablet by mouth daily.     diphenhydramine-acetaminophen (TYLENOL PM) 25-500 MG TABS tablet Take 1 tablet by mouth at bedtime as needed.     escitalopram (LEXAPRO) 20 MG tablet      ferrous sulfate 324 MG TBEC Take 324 mg by mouth 2 (two) times daily.     Multiple Vitamin (MULTIVITAMIN WITH MINERALS) TABS tablet Take 1 tablet by mouth daily.     No current facility-administered medications for this visit.    PHYSICAL EXAMINATION:  ECOG PERFORMANCE STATUS: 0 - Asymptomatic   Vitals:   05/14/23 1315  BP: 117/72  Pulse: 61  Resp: 14  Temp: 98.7 F (37.1 C)  SpO2: 98%    Filed Weights   05/14/23 1315  Weight: 137 lb 12.8 oz (62.5 kg)     Physical Exam Vitals and nursing note reviewed.  Constitutional:      General: Nicole Horne is not in acute distress.    Appearance: Normal appearance. Nicole Horne is normal weight. Nicole Horne is not ill-appearing, toxic-appearing or diaphoretic.     Comments: Here alone.    HENT:     Head: Normocephalic and atraumatic.     Right Ear: External ear normal.     Left Ear: External ear normal.     Nose: Nose normal. No congestion or rhinorrhea.  Eyes:     General: No scleral icterus.    Extraocular Movements: Extraocular movements intact.     Conjunctiva/sclera: Conjunctivae normal.     Pupils: Pupils are equal, round, and reactive to light.  Cardiovascular:     Rate and Rhythm: Normal rate and regular rhythm.     Heart sounds: No murmur heard.    No friction rub. No gallop.  Pulmonary:     Effort: Pulmonary effort is normal.     Breath sounds: Normal breath sounds. No wheezing, rhonchi or rales.  Abdominal:     General: Bowel sounds are normal.     Palpations: Abdomen is soft.  Musculoskeletal:        General: No swelling, tenderness or deformity.     Cervical back: Normal range of motion and neck supple. No rigidity or  tenderness.     Right lower leg: No edema.     Left lower leg: No edema.  Lymphadenopathy:     Head:     Right side of head: No submental, submandibular, tonsillar, preauricular, posterior auricular or occipital adenopathy.     Left side of head: No submental, submandibular, tonsillar, preauricular, posterior auricular or occipital adenopathy.     Cervical: No cervical adenopathy.  Right cervical: No superficial, deep or posterior cervical adenopathy.    Left cervical: No superficial, deep or posterior cervical adenopathy.     Upper Body:     Right upper body: No supraclavicular, axillary, pectoral or epitrochlear adenopathy.     Left upper body: No supraclavicular, axillary, pectoral or epitrochlear adenopathy.  Skin:    General: Skin is warm.     Coloration: Skin is not jaundiced or pale.  Neurological:     General: No focal deficit present.     Mental Status: Nicole Horne is alert and oriented to person, place, and time.     Cranial Nerves: No cranial nerve deficit.  Psychiatric:        Mood and Affect: Mood normal.        Behavior: Behavior normal.        Thought Content: Thought content normal.        Judgment: Judgment normal.      LABORATORY DATA: I have personally reviewed the data as listed:  No visits with results within 1 Month(s) from this visit.  Latest known visit with results is:  Appointment on 12/16/2022  Component Date Value Ref Range Status   WBC Count 12/16/2022 4.4  4.0 - 10.5 K/uL Final   RBC 12/16/2022 3.60 (L)  3.87 - 5.11 MIL/uL Final   Hemoglobin 12/16/2022 10.8 (L)  12.0 - 15.0 g/dL Final   HCT 41/32/4401 34.5 (L)  36.0 - 46.0 % Final   MCV 12/16/2022 95.8  80.0 - 100.0 fL Final   MCH 12/16/2022 30.0  26.0 - 34.0 pg Final   MCHC 12/16/2022 31.3  30.0 - 36.0 g/dL Final   RDW 02/72/5366 13.1  11.5 - 15.5 % Final   Platelet Count 12/16/2022 206  150 - 400 K/uL Final   nRBC 12/16/2022 0.0  0.0 - 0.2 % Final   Neutrophils Relative % 12/16/2022 64  % Final    Neutro Abs 12/16/2022 2.8  1.7 - 7.7 K/uL Final   Lymphocytes Relative 12/16/2022 26  % Final   Lymphs Abs 12/16/2022 1.1  0.7 - 4.0 K/uL Final   Monocytes Relative 12/16/2022 7  % Final   Monocytes Absolute 12/16/2022 0.3  0.1 - 1.0 K/uL Final   Eosinophils Relative 12/16/2022 2  % Final   Eosinophils Absolute 12/16/2022 0.1  0.0 - 0.5 K/uL Final   Basophils Relative 12/16/2022 1  % Final   Basophils Absolute 12/16/2022 0.0  0.0 - 0.1 K/uL Final   Immature Granulocytes 12/16/2022 0  % Final   Abs Immature Granulocytes 12/16/2022 0.01  0.00 - 0.07 K/uL Final   Performed at [facility], [address] [place], [place] [address]    RADIOGRAPHIC STUDIES: I have personally reviewed the radiological images as listed and agree with the findings in the report  No results found.  ASSESSMENT/PLAN  77 y.o. female is here because of  cytopenias.  Medical history notable for hyperlipidemia, osteoarthritis, osteopenia, vitamin D deficiency, nonmelanoma to skin cancers, benign biopsy, remote tobacco use  Anemia:  Mild and likely multifactorial  Nov 13 2022:  Likely culprits are mild CKD and iron deficiency from occult GI blood loss.  Nicole Horne is followed by GI regarding the iron deficiency portion of anemia. Currently on oral iron twice weekly.  Has undergone endoscopy in the past May 14, 2023: Hemoglobin 10.9 which is at her baseline.  B12, folate, ferritin adequate.  No need for intervention at this time  Leukopenia Nov 13 2022- Mild and  intermittent.  No evidence of nutritional disorder or myeloma.  No history of recurrent infections or s/sx of autoimmune disease. Will continue to follow periodic blood counts  May 13, 2020--WBC has fluctuated between 3.6 and 5.1 since April of this year.  No evidence of granulocytopenia.  Patient does have mild intermittent lymphocytopenia.  Can continue to follow   Cancer Staging  No matching staging information was found for the  patient.    No problem-specific Assessment & Plan notes found for this encounter.    No orders of the defined types were placed in this encounter.   30  minutes was spent in patient care.  This included time spent preparing to see the patient (e.g., review of tests), obtaining and/or reviewing separately obtained history, counseling and educating the patient/family/caregiver, ordering medications, tests, or procedures; documenting clinical information in the electronic or other health record, independently interpreting results and communicating results to the patient/family/caregiver as well as coordination of care.       All questions were answered. The patient knows to call the clinic with any problems, questions or concerns.  This note was electronically signed.    Loni Muse, MD  05/14/2023 1:20 PM

## 2023-10-14 DIAGNOSIS — D519 Vitamin B12 deficiency anemia, unspecified: Secondary | ICD-10-CM | POA: Diagnosis not present

## 2023-10-20 DIAGNOSIS — E041 Nontoxic single thyroid nodule: Secondary | ICD-10-CM | POA: Diagnosis not present

## 2023-10-20 DIAGNOSIS — Z1339 Encounter for screening examination for other mental health and behavioral disorders: Secondary | ICD-10-CM | POA: Diagnosis not present

## 2023-10-20 DIAGNOSIS — Z0000 Encounter for general adult medical examination without abnormal findings: Secondary | ICD-10-CM | POA: Diagnosis not present

## 2023-10-20 DIAGNOSIS — Z6823 Body mass index (BMI) 23.0-23.9, adult: Secondary | ICD-10-CM | POA: Diagnosis not present

## 2023-10-20 DIAGNOSIS — D649 Anemia, unspecified: Secondary | ICD-10-CM | POA: Diagnosis not present

## 2023-10-20 DIAGNOSIS — M8580 Other specified disorders of bone density and structure, unspecified site: Secondary | ICD-10-CM | POA: Diagnosis not present

## 2023-10-20 DIAGNOSIS — Z1331 Encounter for screening for depression: Secondary | ICD-10-CM | POA: Diagnosis not present

## 2023-10-20 DIAGNOSIS — Z79899 Other long term (current) drug therapy: Secondary | ICD-10-CM | POA: Diagnosis not present

## 2023-10-20 DIAGNOSIS — E7800 Pure hypercholesterolemia, unspecified: Secondary | ICD-10-CM | POA: Diagnosis not present

## 2023-10-28 DIAGNOSIS — Z79899 Other long term (current) drug therapy: Secondary | ICD-10-CM | POA: Diagnosis not present

## 2023-10-28 DIAGNOSIS — M810 Age-related osteoporosis without current pathological fracture: Secondary | ICD-10-CM | POA: Diagnosis not present

## 2023-10-31 DIAGNOSIS — E041 Nontoxic single thyroid nodule: Secondary | ICD-10-CM | POA: Diagnosis not present

## 2023-11-10 ENCOUNTER — Other Ambulatory Visit: Payer: Self-pay | Admitting: Oncology

## 2023-11-10 DIAGNOSIS — D649 Anemia, unspecified: Secondary | ICD-10-CM

## 2023-11-10 NOTE — Progress Notes (Signed)
 [facility]  [address] [place],  [place]  [address] [phone]  Clinic Day:  11/18/2023  Referring physician: Russell Court, DO   HISTORY OF PRESENT ILLNESS:  The patient is a 78 y.o. Horne who our office has seen for anemia/borderline pancytopenia.  Her anemia appears to be partially due to kidney disease.   She comes in today for routine follow-up.  Since her last visit, the patient has been doing fairly well.  She denies having increased fatigue or any overt forms of blood loss which concern her for progressive anemia.  PHYSICAL EXAM:  Blood pressure 134/71, pulse (!) 56, temperature 98.7 F (37.1 C), temperature source Oral, resp. rate 14, height 5' 2.5" (1.588 m), weight 133 lb 1.6 oz (60.4 kg), SpO2 96%. Wt Readings from Last 3 Encounters:  11/11/23 133 lb 1.6 oz (60.4 kg)  05/14/23 137 lb 12.8 oz (62.5 kg)  11/13/22 130 lb 12.8 oz (59.3 kg)   Body mass index is 23.96 kg/m. Performance status (ECOG): 1 - Symptomatic but completely ambulatory Physical Exam Constitutional:      Appearance: Normal appearance. She is not ill-appearing.  HENT:     Mouth/Throat:     Mouth: Mucous membranes are moist.     Pharynx: Oropharynx is clear. No oropharyngeal exudate or posterior oropharyngeal erythema.  Cardiovascular:     Rate and Rhythm: Normal rate and regular rhythm.     Heart sounds: No murmur heard.    No friction rub. No gallop.  Pulmonary:     Effort: Pulmonary effort is normal. No respiratory distress.     Breath sounds: Normal breath sounds. No wheezing, rhonchi or rales.  Abdominal:     General: Bowel sounds are normal. There is no distension.     Palpations: Abdomen is soft. There is no mass.     Tenderness: There is no abdominal tenderness.  Musculoskeletal:        General: No swelling.     Right lower leg: No edema.     Left lower leg: No edema.  Lymphadenopathy:     Cervical: No cervical adenopathy.     Upper Body:      Right upper body: No supraclavicular or axillary adenopathy.     Left upper body: No supraclavicular or axillary adenopathy.     Lower Body: No right inguinal adenopathy. No left inguinal adenopathy.  Skin:    General: Skin is warm.     Coloration: Skin is not jaundiced.     Findings: No lesion or rash.  Neurological:     General: No focal deficit present.     Mental Status: She is alert and oriented to person, place, and time. Mental status is at baseline.  Psychiatric:        Mood and Affect: Mood normal.        Behavior: Behavior normal.        Thought Content: Thought content normal.     LABS:      Latest Ref Rng & Units 11/11/2023    2:05 PM 05/14/2023    1:53 PM 12/16/2022    8:45 AM  CBC  WBC 4.0 - 10.5 K/uL 5.3  5.1  4.4   Hemoglobin 12.0 - 15.0 g/dL Nicole.9  56.2  13.0   Hematocrit 36.0 - 46.0 % 32.1  32.9  34.5   Platelets 150 - 400 K/uL 215  195  206       Latest Ref Rng & Units 11/11/2023  2:05 PM  CMP  Glucose 70 - 99 mg/dL 97   BUN 8 - 23 mg/dL 25   Creatinine 2.95 - 1.00 mg/dL 6.21   Sodium 308 - 657 mmol/L 138   Potassium 3.5 - 5.1 mmol/L 4.8   Chloride 98 - 111 mmol/L 103   CO2 22 - 32 mmol/L 27   Calcium 8.9 - 10.3 mg/dL 9.5   Total Protein 6.5 - 8.1 g/dL 6.9   Total Bilirubin 0.0 - 1.2 mg/dL 0.3   Alkaline Phos 38 - 126 U/L 53   AST 15 - 41 U/L 19   ALT 0 - 44 U/L 8     Latest Reference Range & Units 11/11/23 14:05  Iron 28 - 170 ug/dL 57  UIBC ug/dL 846  TIBC 962 - 952 ug/dL 841  Saturation Ratios 10.4 - 31.8 % 22  Ferritin 11 - 307 ng/mL 62  Folate >5.9 ng/mL >40.0  Vitamin B12 180 - 914 pg/mL 1,332 (H)  (H): Data is abnormally high   ASSESSMENT & PLAN:  Assessment/Plan:  A 78 y.o. Horne with anemia that appears to be related to mild kidney disease.  Although not normal, her hemoglobin remains above 10.  Clinically, the patient is doing well.  As that is the case, her anemia will continue to be followed conservatively for now.  If it  falls below 10, I would consider starting Red cell shot therapy.  I would also consider performing a bone marrow biopsy if it falls significantly over time.  Otherwise, I will see her back in 4 months for repeat clinical assessment.  The patient understands all the plans discussed today and is in agreement with them.      Bilal Manzer Felicia Horde, MD

## 2023-11-11 ENCOUNTER — Inpatient Hospital Stay (HOSPITAL_BASED_OUTPATIENT_CLINIC_OR_DEPARTMENT_OTHER): Payer: Medicare PPO | Admitting: Oncology

## 2023-11-11 ENCOUNTER — Ambulatory Visit: Payer: Medicare PPO | Admitting: Oncology

## 2023-11-11 ENCOUNTER — Other Ambulatory Visit: Payer: Medicare PPO

## 2023-11-11 ENCOUNTER — Inpatient Hospital Stay: Payer: Medicare PPO | Attending: Oncology

## 2023-11-11 ENCOUNTER — Other Ambulatory Visit: Payer: Self-pay | Admitting: Oncology

## 2023-11-11 VITALS — BP 134/71 | HR 56 | Temp 98.7°F | Resp 14 | Ht 62.5 in | Wt 133.1 lb

## 2023-11-11 DIAGNOSIS — D649 Anemia, unspecified: Secondary | ICD-10-CM | POA: Insufficient documentation

## 2023-11-11 DIAGNOSIS — N189 Chronic kidney disease, unspecified: Secondary | ICD-10-CM

## 2023-11-11 DIAGNOSIS — D631 Anemia in chronic kidney disease: Secondary | ICD-10-CM | POA: Diagnosis not present

## 2023-11-11 LAB — CBC WITH DIFFERENTIAL (CANCER CENTER ONLY)
Abs Immature Granulocytes: 0.03 10*3/uL (ref 0.00–0.07)
Basophils Absolute: 0.1 10*3/uL (ref 0.0–0.1)
Basophils Relative: 1 %
Eosinophils Absolute: 0.2 10*3/uL (ref 0.0–0.5)
Eosinophils Relative: 3 %
HCT: 32.1 % — ABNORMAL LOW (ref 36.0–46.0)
Hemoglobin: 10.2 g/dL — ABNORMAL LOW (ref 12.0–15.0)
Immature Granulocytes: 1 %
Lymphocytes Relative: 19 %
Lymphs Abs: 1 10*3/uL (ref 0.7–4.0)
MCH: 30 pg (ref 26.0–34.0)
MCHC: 31.8 g/dL (ref 30.0–36.0)
MCV: 94.4 fL (ref 80.0–100.0)
Monocytes Absolute: 0.4 10*3/uL (ref 0.1–1.0)
Monocytes Relative: 7 %
Neutro Abs: 3.7 10*3/uL (ref 1.7–7.7)
Neutrophils Relative %: 69 %
Platelet Count: 215 10*3/uL (ref 150–400)
RBC: 3.4 MIL/uL — ABNORMAL LOW (ref 3.87–5.11)
RDW: 12.4 % (ref 11.5–15.5)
WBC Count: 5.3 10*3/uL (ref 4.0–10.5)
nRBC: 0 % (ref 0.0–0.2)
nRBC: 0 /100{WBCs}

## 2023-11-11 LAB — CMP (CANCER CENTER ONLY)
ALT: 8 U/L (ref 0–44)
AST: 19 U/L (ref 15–41)
Albumin: 3.9 g/dL (ref 3.5–5.0)
Alkaline Phosphatase: 53 U/L (ref 38–126)
Anion gap: 8 (ref 5–15)
BUN: 25 mg/dL — ABNORMAL HIGH (ref 8–23)
CO2: 27 mmol/L (ref 22–32)
Calcium: 9.5 mg/dL (ref 8.9–10.3)
Chloride: 103 mmol/L (ref 98–111)
Creatinine: 1.15 mg/dL — ABNORMAL HIGH (ref 0.44–1.00)
GFR, Estimated: 49 mL/min — ABNORMAL LOW (ref >60)
Glucose, Bld: 97 mg/dL (ref 70–99)
Potassium: 4.8 mmol/L (ref 3.5–5.1)
Sodium: 138 mmol/L (ref 135–145)
Total Bilirubin: 0.3 mg/dL (ref 0.0–1.2)
Total Protein: 6.9 g/dL (ref 6.5–8.1)

## 2023-11-11 LAB — IRON AND TIBC
Iron: 57 ug/dL (ref 28–170)
Saturation Ratios: 22 % (ref 10.4–31.8)
TIBC: 258 ug/dL (ref 250–450)
UIBC: 201 ug/dL

## 2023-11-11 LAB — FERRITIN: Ferritin: 62 ng/mL (ref 11–307)

## 2023-11-11 LAB — FOLATE: Folate: >40 ng/mL (ref >5.9)

## 2023-11-11 LAB — VITAMIN B12: Vitamin B-12: 1332 pg/mL — ABNORMAL HIGH (ref 180–914)

## 2023-11-12 ENCOUNTER — Telehealth: Payer: Self-pay | Admitting: Oncology

## 2023-11-12 NOTE — Telephone Encounter (Signed)
 Patient has been scheduled for follow-up visit per 11/11/23 LOS.  Pt aware of scheduled appt details.

## 2023-11-21 DIAGNOSIS — D519 Vitamin B12 deficiency anemia, unspecified: Secondary | ICD-10-CM | POA: Diagnosis not present

## 2023-12-23 DIAGNOSIS — D519 Vitamin B12 deficiency anemia, unspecified: Secondary | ICD-10-CM | POA: Diagnosis not present

## 2024-01-07 ENCOUNTER — Other Ambulatory Visit: Payer: Self-pay | Admitting: Family Medicine

## 2024-01-07 DIAGNOSIS — Z1231 Encounter for screening mammogram for malignant neoplasm of breast: Secondary | ICD-10-CM

## 2024-01-14 ENCOUNTER — Ambulatory Visit
Admission: RE | Admit: 2024-01-14 | Discharge: 2024-01-14 | Disposition: A | Discharge status: Home / Self Care | Source: Ambulatory Visit | Attending: Family Medicine | Admitting: Family Medicine

## 2024-01-14 DIAGNOSIS — Z1231 Encounter for screening mammogram for malignant neoplasm of breast: Secondary | ICD-10-CM

## 2024-01-27 DIAGNOSIS — D519 Vitamin B12 deficiency anemia, unspecified: Secondary | ICD-10-CM | POA: Diagnosis not present

## 2024-03-04 DIAGNOSIS — D519 Vitamin B12 deficiency anemia, unspecified: Secondary | ICD-10-CM | POA: Diagnosis not present

## 2024-03-16 ENCOUNTER — Ambulatory Visit: Admitting: Oncology

## 2024-03-16 ENCOUNTER — Other Ambulatory Visit

## 2024-03-22 NOTE — Progress Notes (Unsigned)
 [facility]  [address] [place],  [place]  [address] [phone]  Clinic Day:  03/23/2024  Referring physician: Conley Dene BROCKS, DO   HISTORY OF PRESENT ILLNESS:  The patient is a 78 y.o. female who our office has seen for anemia/borderline pancytopenia.  Her anemia appears to be partially due to kidney disease.   She comes in today for routine follow-up.  Since her last visit, the patient has been doing fairly well.  She denies having increased fatigue or any overt forms of blood loss which concern her for progressive anemia.  PHYSICAL EXAM:  Blood pressure (!) 120/59, pulse 60, temperature 98.7 F (37.1 C), temperature source Oral, resp. rate 14, height 5' 2.5 (1.588 m), weight 128 lb 6.4 oz (58.2 kg), SpO2 97%. Wt Readings from Last 3 Encounters:  03/23/24 128 lb 6.4 oz (58.2 kg)  11/11/23 133 lb 1.6 oz (60.4 kg)  05/14/23 137 lb 12.8 oz (62.5 kg)   Body mass index is 23.11 kg/m. Performance status (ECOG): 1 - Symptomatic but completely ambulatory Physical Exam Constitutional:      Appearance: Normal appearance. She is not ill-appearing.  HENT:     Mouth/Throat:     Mouth: Mucous membranes are moist.     Pharynx: Oropharynx is clear. No oropharyngeal exudate or posterior oropharyngeal erythema.  Cardiovascular:     Rate and Rhythm: Normal rate and regular rhythm.     Heart sounds: No murmur heard.    No friction rub. No gallop.  Pulmonary:     Effort: Pulmonary effort is normal. No respiratory distress.     Breath sounds: Normal breath sounds. No wheezing, rhonchi or rales.  Abdominal:     General: Bowel sounds are normal. There is no distension.     Palpations: Abdomen is soft. There is no mass.     Tenderness: There is no abdominal tenderness.  Musculoskeletal:        General: No swelling.     Right lower leg: No edema.     Left lower leg: No edema.  Lymphadenopathy:     Cervical: No cervical adenopathy.     Upper Body:      Right upper body: No supraclavicular or axillary adenopathy.     Left upper body: No supraclavicular or axillary adenopathy.     Lower Body: No right inguinal adenopathy. No left inguinal adenopathy.  Skin:    General: Skin is warm.     Coloration: Skin is not jaundiced.     Findings: No lesion or rash.  Neurological:     General: No focal deficit present.     Mental Status: She is alert and oriented to person, place, and time. Mental status is at baseline.  Psychiatric:        Mood and Affect: Mood normal.        Behavior: Behavior normal.        Thought Content: Thought content normal.     LABS:      Latest Ref Rng & Units 03/23/2024    1:05 PM 11/11/2023    2:05 PM 05/14/2023    1:53 PM  CBC  WBC 4.0 - 10.5 K/uL 4.5  5.3  5.1   Hemoglobin 12.0 - 15.0 g/dL 89.3  89.7  89.0   Hematocrit 36.0 - 46.0 % 32.9  32.1  32.9   Platelets 150 - 400 K/uL 175  215  195       Latest Ref Rng & Units 03/23/2024  1:05 PM 11/11/2023    2:05 PM  CMP  Glucose 70 - 99 mg/dL 883  97   BUN 8 - 23 mg/dL 29  25   Creatinine 9.55 - 1.00 mg/dL 8.69  8.84   Sodium 864 - 145 mmol/L 139  138   Potassium 3.5 - 5.1 mmol/L 4.3  4.8   Chloride 98 - 111 mmol/L 102  103   CO2 22 - 32 mmol/L 26  27   Calcium 8.9 - 10.3 mg/dL 9.4  9.5   Total Protein 6.5 - 8.1 g/dL 7.0  6.9   Total Bilirubin 0.0 - 1.2 mg/dL 0.3  0.3   Alkaline Phos 38 - 126 U/L 50  53   AST 15 - 41 U/L 20  19   ALT 0 - 44 U/L 6  8     Latest Reference Range & Units 03/23/24 13:03  Iron 28 - 170 ug/dL 83  UIBC ug/dL 795  TIBC 749 - 549 ug/dL 712  Saturation Ratios 10.4 - 31.8 % 29  Ferritin 11 - 307 ng/mL 47    ASSESSMENT & PLAN:  Assessment/Plan:  A 78 y.o. female with anemia that appears to be related to mild kidney disease.  Although not normal, her hemoglobin remains above 10.  Clinically, the patient is doing well.  As that is the case, her anemia will continue to be followed conservatively for now.  I will see her back in 6  months for repeat clinical assessment.  The patient understands all the plans discussed today and is in agreement with them.      Trissa Molina DELENA Kerns, MD

## 2024-03-23 ENCOUNTER — Inpatient Hospital Stay: Attending: Oncology

## 2024-03-23 ENCOUNTER — Inpatient Hospital Stay (HOSPITAL_BASED_OUTPATIENT_CLINIC_OR_DEPARTMENT_OTHER): Admitting: Oncology

## 2024-03-23 ENCOUNTER — Other Ambulatory Visit: Payer: Self-pay | Admitting: Oncology

## 2024-03-23 VITALS — BP 120/59 | HR 60 | Temp 98.7°F | Resp 14 | Ht 62.5 in | Wt 128.4 lb

## 2024-03-23 DIAGNOSIS — D649 Anemia, unspecified: Secondary | ICD-10-CM | POA: Insufficient documentation

## 2024-03-23 DIAGNOSIS — N289 Disorder of kidney and ureter, unspecified: Secondary | ICD-10-CM | POA: Diagnosis not present

## 2024-03-23 DIAGNOSIS — D631 Anemia in chronic kidney disease: Secondary | ICD-10-CM | POA: Diagnosis not present

## 2024-03-23 DIAGNOSIS — N189 Chronic kidney disease, unspecified: Secondary | ICD-10-CM | POA: Diagnosis not present

## 2024-03-23 DIAGNOSIS — D61818 Other pancytopenia: Secondary | ICD-10-CM | POA: Insufficient documentation

## 2024-03-23 LAB — CBC WITH DIFFERENTIAL (CANCER CENTER ONLY)
Abs Immature Granulocytes: 0.01 K/uL (ref 0.00–0.07)
Basophils Absolute: 0.1 K/uL (ref 0.0–0.1)
Basophils Relative: 1 %
Eosinophils Absolute: 0.1 K/uL (ref 0.0–0.5)
Eosinophils Relative: 2 %
HCT: 32.9 % — ABNORMAL LOW (ref 36.0–46.0)
Hemoglobin: 10.6 g/dL — ABNORMAL LOW (ref 12.0–15.0)
Immature Granulocytes: 0 %
Lymphocytes Relative: 26 %
Lymphs Abs: 1.2 K/uL (ref 0.7–4.0)
MCH: 30.7 pg (ref 26.0–34.0)
MCHC: 32.2 g/dL (ref 30.0–36.0)
MCV: 95.4 fL (ref 80.0–100.0)
Monocytes Absolute: 0.3 K/uL (ref 0.1–1.0)
Monocytes Relative: 7 %
Neutro Abs: 2.9 K/uL (ref 1.7–7.7)
Neutrophils Relative %: 64 %
Platelet Count: 175 K/uL (ref 150–400)
RBC: 3.45 MIL/uL — ABNORMAL LOW (ref 3.87–5.11)
RDW: 12.2 % (ref 11.5–15.5)
WBC Count: 4.5 K/uL (ref 4.0–10.5)
nRBC: 0 % (ref 0.0–0.2)

## 2024-03-23 LAB — CMP (CANCER CENTER ONLY)
ALT: 6 U/L (ref 0–44)
AST: 20 U/L (ref 15–41)
Albumin: 4 g/dL (ref 3.5–5.0)
Alkaline Phosphatase: 50 U/L (ref 38–126)
Anion gap: 11 (ref 5–15)
BUN: 29 mg/dL — ABNORMAL HIGH (ref 8–23)
CO2: 26 mmol/L (ref 22–32)
Calcium: 9.4 mg/dL (ref 8.9–10.3)
Chloride: 102 mmol/L (ref 98–111)
Creatinine: 1.3 mg/dL — ABNORMAL HIGH (ref 0.44–1.00)
GFR, Estimated: 42 mL/min — ABNORMAL LOW (ref >60)
Glucose, Bld: 116 mg/dL — ABNORMAL HIGH (ref 70–99)
Potassium: 4.3 mmol/L (ref 3.5–5.1)
Sodium: 139 mmol/L (ref 135–145)
Total Bilirubin: 0.3 mg/dL (ref 0.0–1.2)
Total Protein: 7 g/dL (ref 6.5–8.1)

## 2024-03-23 LAB — FERRITIN: Ferritin: 47 ng/mL (ref 11–307)

## 2024-03-23 LAB — IRON AND TIBC
Iron: 83 ug/dL (ref 28–170)
Saturation Ratios: 29 % (ref 10.4–31.8)
TIBC: 287 ug/dL (ref 250–450)
UIBC: 204 ug/dL

## 2024-04-05 DIAGNOSIS — D519 Vitamin B12 deficiency anemia, unspecified: Secondary | ICD-10-CM | POA: Diagnosis not present

## 2024-04-05 DIAGNOSIS — Z23 Encounter for immunization: Secondary | ICD-10-CM | POA: Diagnosis not present

## 2024-05-12 DIAGNOSIS — D519 Vitamin B12 deficiency anemia, unspecified: Secondary | ICD-10-CM | POA: Diagnosis not present

## 2024-09-20 ENCOUNTER — Ambulatory Visit: Admitting: Oncology

## 2024-09-20 ENCOUNTER — Other Ambulatory Visit
